# Patient Record
Sex: Male | Born: 1935
Health system: Southern US, Community
[De-identification: ages and names within clinical notes are randomized; demographics above are authoritative.]

## PROBLEM LIST (undated history)

## (undated) DIAGNOSIS — N400 Enlarged prostate without lower urinary tract symptoms: Secondary | ICD-10-CM

## (undated) DIAGNOSIS — R109 Unspecified abdominal pain: Secondary | ICD-10-CM

## (undated) DIAGNOSIS — R6889 Other general symptoms and signs: Secondary | ICD-10-CM

## (undated) DIAGNOSIS — E785 Hyperlipidemia, unspecified: Secondary | ICD-10-CM

## (undated) DIAGNOSIS — I259 Chronic ischemic heart disease, unspecified: Secondary | ICD-10-CM

## (undated) DIAGNOSIS — I2510 Atherosclerotic heart disease of native coronary artery without angina pectoris: Secondary | ICD-10-CM

## (undated) DIAGNOSIS — R310 Gross hematuria: Secondary | ICD-10-CM

## (undated) HISTORY — DX: Atherosclerotic heart disease of native coronary artery without angina pectoris: I25.10

## (undated) HISTORY — DX: Unspecified abdominal pain: R10.9

## (undated) HISTORY — DX: Chronic ischemic heart disease, unspecified: I25.9

## (undated) HISTORY — DX: Gross hematuria: R31.0

## (undated) HISTORY — DX: Other general symptoms and signs: R68.89

## (undated) HISTORY — DX: Benign prostatic hyperplasia without lower urinary tract symptoms: N40.0

## (undated) HISTORY — DX: Hyperlipidemia, unspecified: E78.5

---

## 1986-12-13 HISTORY — PX: HERNIA REPAIR: SHX51

## 1990-12-13 HISTORY — PX: OTHER SURGICAL HISTORY: SHX169

## 1997-12-13 HISTORY — PX: CARDIAC CATHETERIZATION: SHX172

## 1998-09-17 ENCOUNTER — Ambulatory Visit (HOSPITAL_COMMUNITY): Admission: RE | Admit: 1998-09-17 | Discharge: 1998-09-17 | Payer: Self-pay | Admitting: Cardiology

## 2005-09-24 ENCOUNTER — Ambulatory Visit: Payer: Self-pay | Admitting: Internal Medicine

## 2007-05-30 ENCOUNTER — Ambulatory Visit: Payer: Self-pay | Admitting: Internal Medicine

## 2007-05-30 LAB — CONVERTED CEMR LAB
ALT: 42 units/L — ABNORMAL HIGH (ref 0–40)
HDL: 47.4 mg/dL (ref >39.0)
LDL Cholesterol: 63 mg/dL (ref 0–99)
PSA: 0.85 ng/mL (ref 0.10–4.00)
VLDL: 16 mg/dL (ref 0–40)

## 2008-01-05 ENCOUNTER — Ambulatory Visit: Payer: Self-pay | Admitting: Internal Medicine

## 2008-01-05 DIAGNOSIS — R109 Unspecified abdominal pain: Secondary | ICD-10-CM

## 2008-01-05 HISTORY — DX: Unspecified abdominal pain: R10.9

## 2008-01-07 DIAGNOSIS — E785 Hyperlipidemia, unspecified: Secondary | ICD-10-CM

## 2008-01-07 DIAGNOSIS — I2510 Atherosclerotic heart disease of native coronary artery without angina pectoris: Secondary | ICD-10-CM

## 2008-01-07 DIAGNOSIS — N400 Enlarged prostate without lower urinary tract symptoms: Secondary | ICD-10-CM

## 2008-01-07 HISTORY — DX: Atherosclerotic heart disease of native coronary artery without angina pectoris: I25.10

## 2008-01-07 HISTORY — DX: Benign prostatic hyperplasia without lower urinary tract symptoms: N40.0

## 2008-01-07 HISTORY — DX: Hyperlipidemia, unspecified: E78.5

## 2008-01-08 ENCOUNTER — Telehealth: Payer: Self-pay | Admitting: Internal Medicine

## 2008-01-09 ENCOUNTER — Ambulatory Visit: Payer: Self-pay | Admitting: Internal Medicine

## 2008-01-11 ENCOUNTER — Encounter: Payer: Self-pay | Admitting: Internal Medicine

## 2008-05-23 ENCOUNTER — Ambulatory Visit: Payer: Self-pay | Admitting: Internal Medicine

## 2008-05-23 ENCOUNTER — Telehealth: Payer: Self-pay | Admitting: Internal Medicine

## 2008-05-23 DIAGNOSIS — R310 Gross hematuria: Secondary | ICD-10-CM

## 2008-05-23 HISTORY — DX: Gross hematuria: R31.0

## 2008-05-23 LAB — CONVERTED CEMR LAB
Bacteria, UA: NEGATIVE
Leukocytes, UA: NEGATIVE
pH: 5 (ref 5.0–8.0)

## 2008-06-06 ENCOUNTER — Ambulatory Visit: Payer: Self-pay | Admitting: Internal Medicine

## 2008-07-06 IMAGING — CR DG LUMBAR SPINE COMPLETE 4+V
5 series · 5 of 5 positions shown · non-contrast
Comparison: none

CLINICAL DATA: 77-year-old with left-sided low back pain.  No known injury.
LUMBAR SPINE - 4 VIEW:

[view not recorded (1 of 5)]
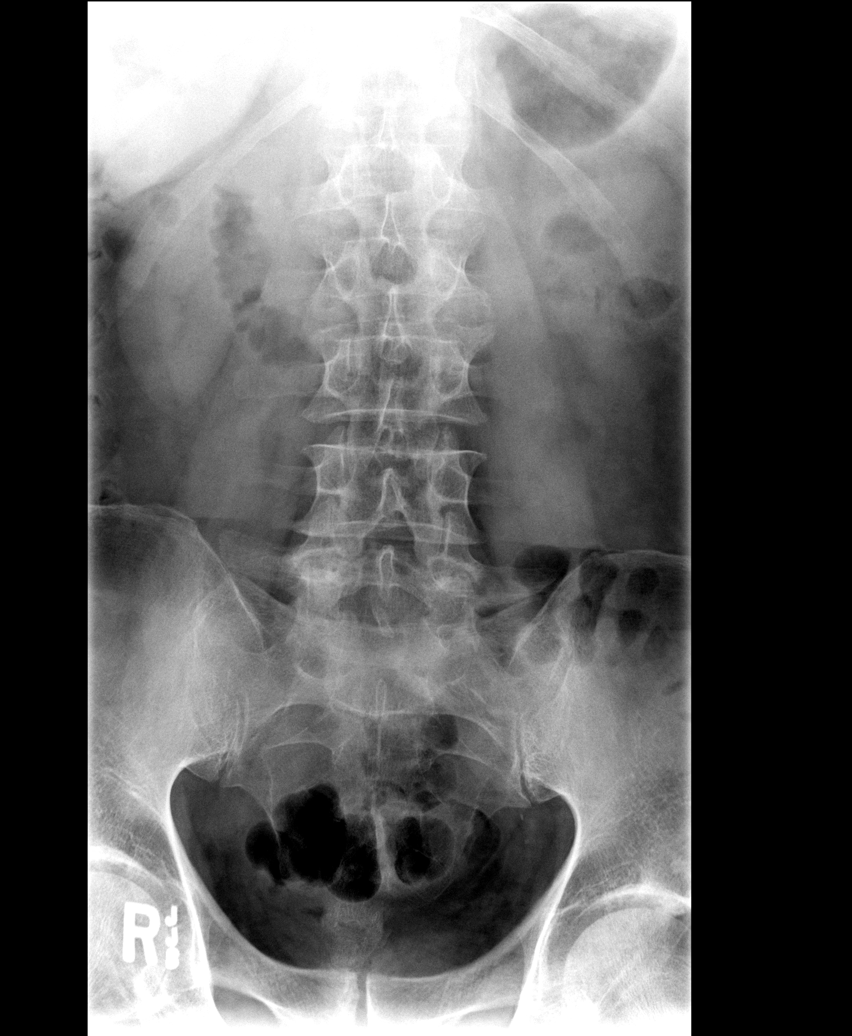

[view not recorded (2 of 5)]
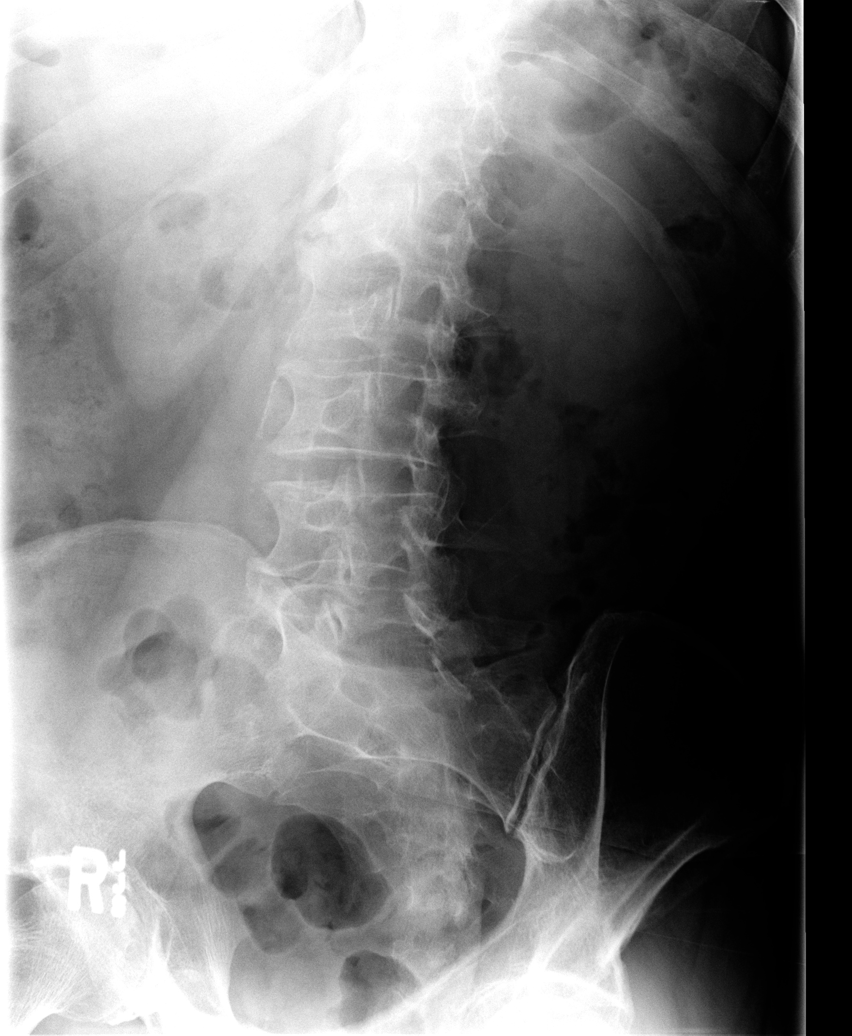

[view not recorded (3 of 5)]
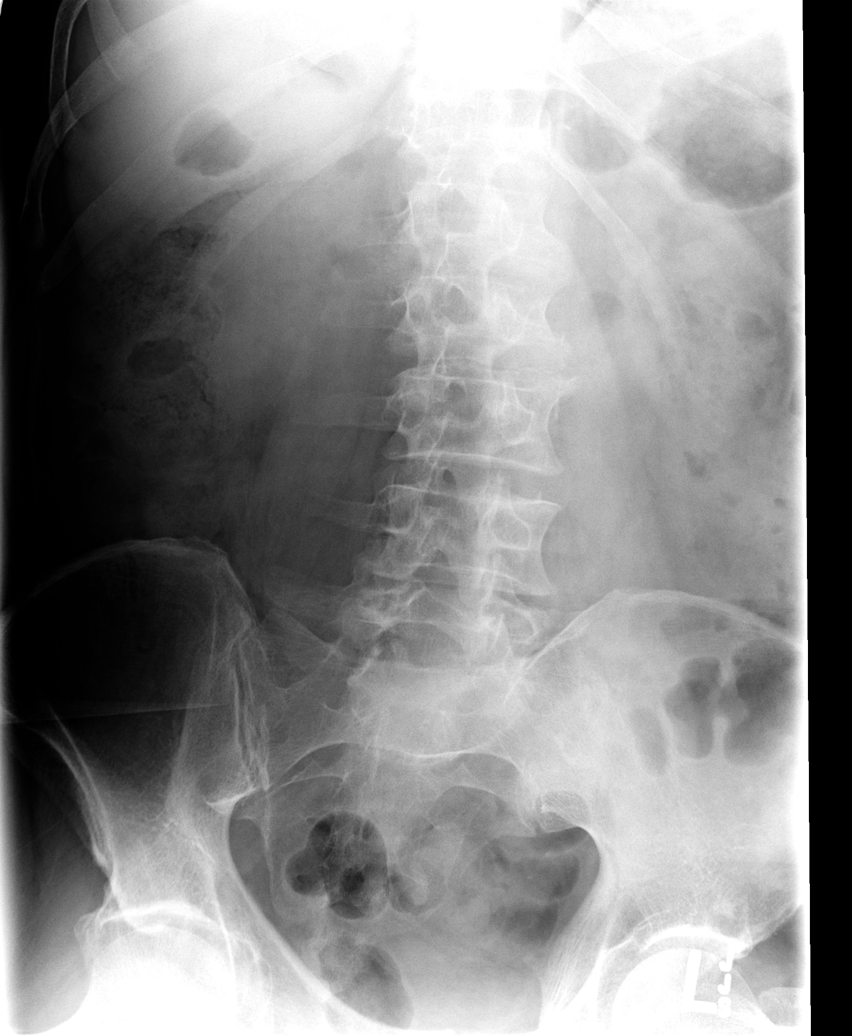

[view not recorded (4 of 5)]
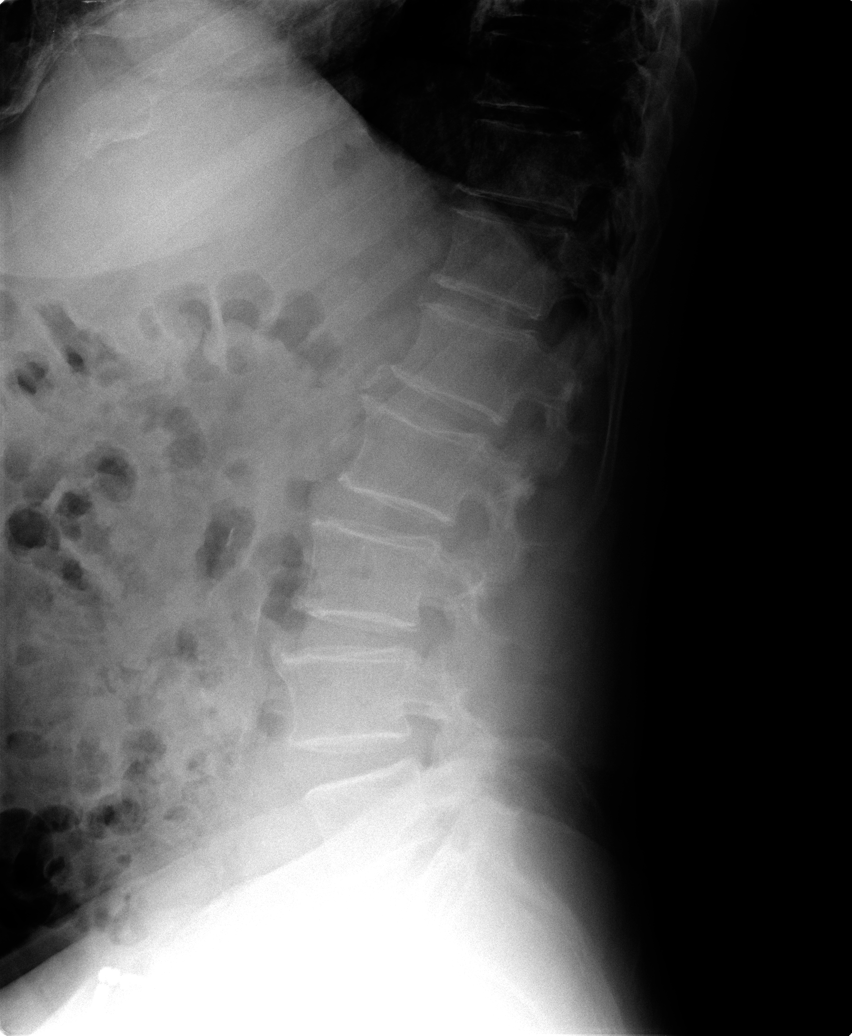

[view not recorded (5 of 5)]
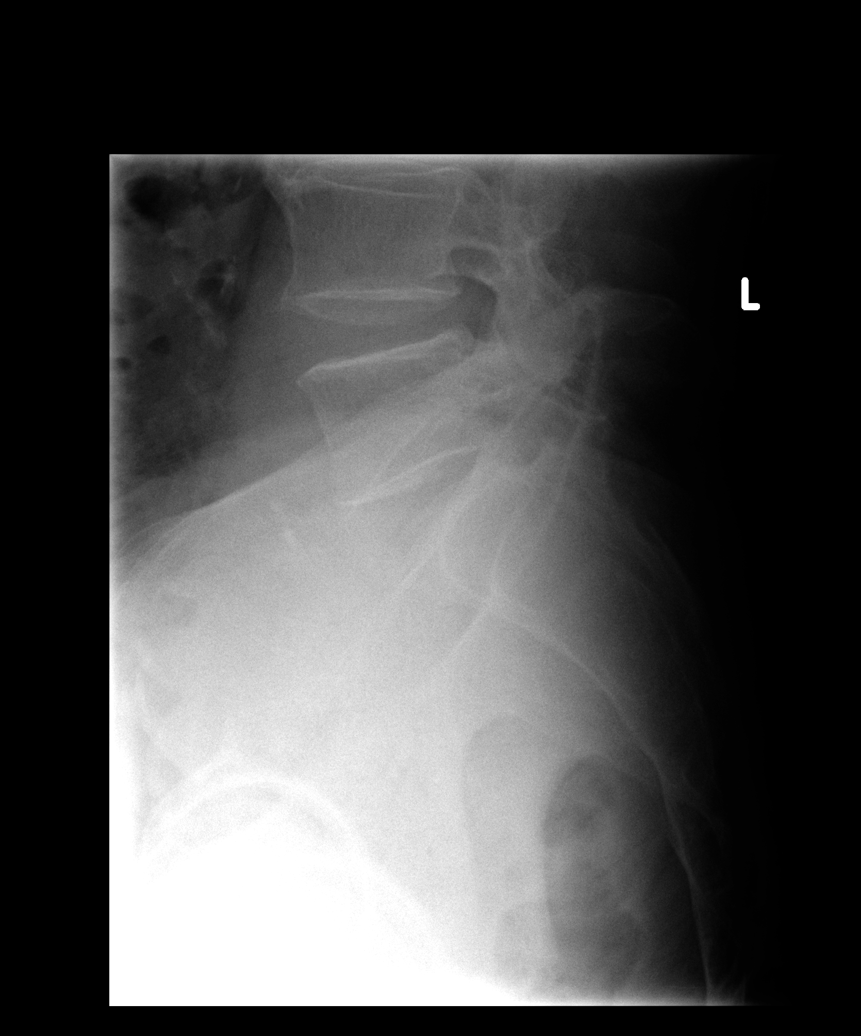

[5 of 5 positions shown; findings below may reference images not displayed]

FINDINGS: AP lateral and oblique images of the lumbar spine were obtained.  Mild degenerative endplate changes throughout the lumbar spine without significant disc space loss.  Normal alignment of the lumbar spine.  No definite pars defect.
IMPRESSION: Mild degenerative changes without acute findings.

## 2009-06-02 ENCOUNTER — Ambulatory Visit: Payer: Self-pay | Admitting: Internal Medicine

## 2010-10-19 ENCOUNTER — Ambulatory Visit: Payer: Self-pay | Admitting: Cardiology

## 2010-10-28 ENCOUNTER — Telehealth (INDEPENDENT_AMBULATORY_CARE_PROVIDER_SITE_OTHER): Payer: Self-pay

## 2010-10-29 ENCOUNTER — Encounter: Payer: Self-pay | Admitting: Cardiovascular Disease

## 2010-10-29 ENCOUNTER — Ambulatory Visit: Payer: Self-pay | Admitting: Cardiology

## 2010-10-29 ENCOUNTER — Encounter: Payer: Self-pay | Admitting: Cardiology

## 2010-10-29 ENCOUNTER — Ambulatory Visit: Payer: Self-pay

## 2010-10-29 ENCOUNTER — Encounter (HOSPITAL_COMMUNITY)
Admission: RE | Admit: 2010-10-29 | Discharge: 2010-12-12 | Payer: Self-pay | Source: Home / Self Care | Attending: Cardiology | Admitting: Cardiology

## 2011-01-12 NOTE — Progress Notes (Signed)
Summary: Nuc. Pre-Procedure  Phone Note Outgoing Call Call back at [facility] Phone [phone]   Call placed by: Irean Hong, RN,  October 28, 2010 1:46 PM Summary of Call: Left message with information on Myoview Information Sheet (see scanned document for details).      Nuclear Med Background Indications for Stress Test: Evaluation for Ischemia   History: Angioplasty, Heart Catheterization, Myocardial Infarction, Myocardial Perfusion Study  History Comments: '90 MI> PTCA LCFX. '95 PTCA RCA. '99 Cath: 60% DX. '09 MPS: (-) ischemia, fixed lateral defect, EF=NL.     Nuclear Pre-Procedure Cardiac Risk Factors: History of Smoking, Lipids Height (in): 70

## 2011-01-12 NOTE — Assessment & Plan Note (Signed)
Summary: Cardiology Nuclear Testing  Nuclear Med Background Indications for Stress Test: Evaluation for Ischemia, PTCA Patency   History: Angioplasty, Heart Catheterization, Myocardial Infarction, Myocardial Perfusion Study  History Comments: '09 MPS:no sig. ischemia, fixed lateral defect   Symptoms Comments: No cardiac complaints   Nuclear Pre-Procedure Cardiac Risk Factors: History of Smoking, Lipids Caffeine/Decaff Intake: None NPO After: 5:00 PM Lungs: Clear IV 0.9% NS with Angio Cath: 20g     IV Site: R Antecubital IV Started by: Irean Hong, RN Chest Size (in) 40     Height (in): 70 Weight (lb): 172 BMI: 24.77 Tech Comments: No meds. today  Nuclear Med Study 1 or 2 day study:  1 day     Stress Test Type:  Stress Reading MD:  Olga Millers, MD     Referring MD:  Roger Shelter, MD Resting Radionuclide:  Technetium 47m Tetrofosmin     Resting Radionuclide Dose:  11 mCi  Stress Radionuclide:  Technetium 69m Tetrofosmin     Stress Radionuclide Dose:  33 mCi   Stress Protocol Exercise Time (min):  11:31 min     Max HR:  139 bpm     Predicted Max HR:  142 bpm  Max Systolic BP: 162 mm Hg     Percent Max HR:  97.89 %     METS: 13.4 Rate Pressure Product:  84132    Stress Test Technologist:  [employer]a [employer], CMA-N     Nuclear Technologist:  Doyne Keel, CNMT  Rest Procedure  Myocardial perfusion imaging was performed at rest 45 minutes following the intravenous administration of Technetium 43m Tetrofosmin.  Stress Procedure  The patient exercised for11:31.  The patient stopped due to fatigue and denied any chest pain.  There were no significant ST-T wave changes, only occasional PVC's and PAC's.  Technetium 57m Tetrofosmin was injected at peak exercise and myocardial perfusion imaging was performed after a brief delay.  QPS Raw Data Images:  There is no interference from other nuclear activity. Stress Images:  There is decreased uptake in the inferolateral wall Rest  Images:  There is decreased uptake in the inferolateral wall; slightly less prominent compared to the stress images. Subtraction (SDS):  These findings are consistent with prior inferolateral infarct and very mild peri-infarct ischemia. Transient Ischemic Dilatation:  .93  (Normal <1.22)  Lung/Heart Ratio:  .33  (Normal <0.45)  Quantitative Gated Spect Images QGS EDV:  135 ml QGS ESV:  62 ml QGS EF:  54 % QGS cine images:  Akinesis of the inferolateral wall; evidence of LVE.   Overall Impression  Exercise Capacity: Good exercise capacity. BP Response: Normal blood pressure response. Clinical Symptoms: No chest pain ECG Impression: No significant ST segment change suggestive of ischemia. Overall Impression: Abnormal stress nuclear study with prior inferolateral infarct and very mild peri-infarct ischemia.  Appended Document: Cardiology Nuclear Testing copy sent to Dr. Deborah Chalk

## 2011-04-15 ENCOUNTER — Encounter: Payer: Self-pay | Admitting: Internal Medicine

## 2011-04-16 ENCOUNTER — Other Ambulatory Visit (INDEPENDENT_AMBULATORY_CARE_PROVIDER_SITE_OTHER): Payer: Medicare Other

## 2011-04-16 ENCOUNTER — Encounter: Payer: Self-pay | Admitting: Internal Medicine

## 2011-04-16 ENCOUNTER — Ambulatory Visit (INDEPENDENT_AMBULATORY_CARE_PROVIDER_SITE_OTHER): Payer: Medicare Other | Admitting: Internal Medicine

## 2011-04-16 DIAGNOSIS — R310 Gross hematuria: Secondary | ICD-10-CM

## 2011-04-16 DIAGNOSIS — E785 Hyperlipidemia, unspecified: Secondary | ICD-10-CM

## 2011-04-16 DIAGNOSIS — Z0000 Encounter for general adult medical examination without abnormal findings: Secondary | ICD-10-CM

## 2011-04-16 DIAGNOSIS — T887XXA Unspecified adverse effect of drug or medicament, initial encounter: Secondary | ICD-10-CM

## 2011-04-16 DIAGNOSIS — N400 Enlarged prostate without lower urinary tract symptoms: Secondary | ICD-10-CM

## 2011-04-16 LAB — HEPATIC FUNCTION PANEL
ALT: 32 U/L (ref 0–53)
AST: 27 U/L (ref 0–37)
Alkaline Phosphatase: 66 U/L (ref 39–117)
Bilirubin, Direct: 0.2 mg/dL (ref 0.0–0.3)
Total Bilirubin: 0.7 mg/dL (ref 0.3–1.2)

## 2011-04-16 LAB — CBC WITH DIFFERENTIAL/PLATELET
Eosinophils Relative: 2.2 % (ref 0.0–5.0)
HCT: 41.3 % (ref 39.0–52.0)
Hemoglobin: 14 g/dL (ref 13.0–17.0)
Lymphs Abs: 2.3 10*3/uL (ref 0.7–4.0)
Monocytes Relative: 9.1 % (ref 3.0–12.0)
Neutro Abs: 4.3 10*3/uL (ref 1.4–7.7)
WBC: 7.5 10*3/uL (ref 4.5–10.5)

## 2011-04-16 LAB — COMPREHENSIVE METABOLIC PANEL
CO2: 26 mEq/L (ref 19–32)
Creatinine, Ser: 1.1 mg/dL (ref 0.4–1.5)
GFR: 70.1 mL/min (ref >60.00)
Glucose, Bld: 90 mg/dL (ref 70–99)
Total Bilirubin: 0.7 mg/dL (ref 0.3–1.2)

## 2011-04-16 NOTE — Progress Notes (Signed)
Subjective:    Patient ID: Wesley [name], male    DOB: 08/19/1930, 77 y.o.   MRN: [id number]  HPI  The patient is here for annual Medicare wellness examination and management of other chronic and acute problems.   The risk factors are reflected in the social history.  The roster of all physicians providing medical care to patient - is listed in the Snapshot section of the chart.  Activities of daily living:  The patient is 100% inedpendent in all ADLs: dressing, toileting, feeding as well as independent mobility  Home safety : The patient has smoke detectors in the home. They wear seatbelts. firearms are present in the home, kept in a safe fashion. There is no violence in the home.   There is no risks for hepatitis, STDs or HIV. There is no   history of blood transfusion. They have no travel history to infectious disease endemic areas of the world.  The patient has has not seen their dentist in the last six month, although he does have appointment set up in May. They have seen their eye doctor in the last year. They admit to  hearing difficulty, wearing hearing aids and have not had audiologic testing in the last year.  They do not  have excessive sun exposure. Discussed the need for sun protection: hats, long sleeves and use of sunscreen if there is significant sun exposure.   Diet: the importance of a healthy diet is discussed. They do have a healthy (unhealthy-high fat/fast food) diet.  Exercise- works out 2 hrs a day 3-4 days a week.  Past Medical History  Diagnosis Date  . HYPERLIPIDEMIA 01/07/2008  . CORONARY ARTERY DISEASE 01/07/2008  . Gross hematuria 05/23/2008  . BENIGN PROSTATIC HYPERTROPHY 01/07/2008  . FLANK PAIN, LEFT 01/05/2008  . Cataract    Past Surgical History  Procedure Date  . Hernia repair 1988    Right  . Nephrolithiasis 1992    w/treatment   Family History  Problem Relation Age of Onset  . Heart disease Brother   . Hyperlipidemia Brother   . Diabetes Neg  Hx   . Colon cancer Neg Hx   . Lung cancer Other     1 sibling deceased   History   Social History  . Marital Status: Widowed    Spouse Name: N/A    Number of Children: N/A  . Years of Education: N/A   Occupational History  . Not on file.   Social History Main Topics  . Smoking status: Former Smoker    Types: Cigarettes    Quit date: 12/13/1968  . Smokeless tobacco: Not on file  . Alcohol Use: No  . Drug Use: No  . Sexually Active: Not on file   Other Topics Concern  . Not on file   Social History Narrative  . No narrative on file        Review of Systems Review of Systems  Constitutional:  Negative for fever, chills, activity change and unexpected weight change.  HENT:  Positive for hearing loss and tinnitis; Negative for  ear pain, congestion, neck stiffness and postnasal drip.   Eyes: Negative for pain, discharge and visual disturbance.  Respiratory: Negative for chest tightness and wheezing.   Cardiovascular: Negative for chest pain and palpitations.       [No decreased exercise tolerance Gastrointestinal: [No change in bowel habit. No bloating or gas. No reflux or indigestion Genitourinary: Positive for urgency, frequency but better on Gelnique and herbal product;  negative for  flank pain and difficulty urinating.  Musculoskeletal: Negative for myalgias, back pain, arthralgias and gait problem.  Neurological: Negative for dizziness, tremors, weakness and headaches.  Hematological: Negative for adenopathy.  Psychiatric/Behavioral: Negative for behavioral problems and dysphoric mood.       Objective:   Physical Exam WNWD white male in no distress HEENT- hearing aids bilaterally, C&S clear, Lens clear, Fundi-normal, oropharynx without lesions. Neck - supple, no thyromegaly,  Nodes- negative neck, supraclavicular, inquinal Chest - clear to A&P Cor - RRR, no murmur, rubs, gallop; no JVD; no carotid bruits Abd - soft, BS+ , no G/R GU- deferred to Dr.  Vernie Ammons MSK - full ROM, no deformity, Neuro- Awake, conversant, good remote memory, 1/3 word recall at 5 minutes, content was minimal, clock face - unusual drawing a 25hr clock face. Derm - clear        Assessment & Plan:  1. CAD - stable with no c/o chest pain, decreased exercise tolerance of other problems  Plan - continue present regimen           Follow up with Dr. Deborah Chalk as instructed  2. Hyperlipidemia  Plan - lab today with recommendations to follow.   3. GU- patient with BPH. Had a history of hematuria which has cleared. He has Irritible bladder improved with  gelnique  Topical.  Plan - follow-up with Dr. Vernie Ammons as instructed  4. Health Maintenance - interval history is benign. PHysical exam is unremarkable. Labs are pending. He does have mild memory impairment but remains highly functional. He is not current with colorectal cancer screening with last study documented at '83. He will be advised to schedule study. Immunizations - current with tetnus and he reports that he has had pneumonia vaccine. Shingles vaccine not documented.  In summary- a nice man who is medically stable but demonstrates mild memory impairment.  He will return as needed or in 1 year.

## 2011-04-19 ENCOUNTER — Encounter: Payer: Self-pay | Admitting: Internal Medicine

## 2011-04-27 NOTE — Assessment & Plan Note (Signed)
[facility]                           PRIMARY CARE OFFICE NOTE   QUIN, MATHENIA                          MRN:          [id number]  DATE:05/30/2007                            DOB:          January 12, 1930    Wesley [name] is a 77 year old Caucasian gentleman followed  for coronary  artery disease, hyperlipidemia, who presents for followup evaluation and  exam.  The patient was last seen in the office September 24, 2005 for a  pulled muscle in his back.  He reports in the interval he has been  feeling well and doing well.  He has seen Dr. Delfin Edis, his  cardiologist, in followup, 2007.   The patient reports he is in need of a tetanus shot.   The patient reports he does have a problem with snoring and lies on his  side which causes him to have sweating on 1 side.   PAST MEDICAL HISTORY:   SURGICAL:  1. Right herniorrhaphy in 1988.  2. Kidney stones, treatment in 1992.   MEDICAL:  1. Usual childhood disease.  2. MI, 1990, with cardiac cath and angioplasty.  3. Hyperlipidemia.  4. BPH.  5. Cataracts.   CURRENT MEDICATIONS:  1. Lipitor 10 mg daily.  2. Aspirin 325 mg daily.  3. Doxazosin 2 mg nightly.   HABITS:  The patient had a 35-pack-year smoking history, but quit at age  57.  He is an occasional drinker of wine.   FAMILY HISTORY:  Father died of pancreatic cancer.  Mother died of old  age.  The patient had 1 sibling die of lung cancer.  One brother had  heart trouble and high cholesterol.   SOCIAL HISTORY:  The patient has been married 44 years.  He has a grown  son.  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   CHART REVIEW:  Last colonoscopy, May 18, 2004, with a normal study.  Cath was in 1995.  One-day nuclear Cardiolite study performed by Dr.  Deborah Chalk, October of 2001, which was unremarkable.  The patient had  hearing testing March 31, 2007 with results having problem with word  recognition, 68% on the right, 80% on the  left.  He was advised to use  hearing protection.  He does not need hearing amplification at this  time.  The patient had a CT scan of the abdomen and pelvis, November  2005, normal abdomen, normal pelvis.   REVIEW OF SYSTEMS:  The patient has had no constitutional problems.  He  has had an eye exam recently which revealed bilateral cataracts.  No  ears, nose, and throat, cardiovascular, respiratory problems.  No GI  changes.  He has nocturia x3 which is long standing.  The patient does  have some soreness in his left shoulder.  No dermatologic or neurologic  problems.   PHYSICAL EXAMINATION:  Temperature was 97.4, blood pressure 111/67,  pulse was 57, weight 186.  Height is 5 feet 11 inches.  GENERAL APPEARANCE:  A well-nourished, well-developed Caucasian  gentleman, looks younger than his stated chronologic age, in  no acute  distress.  HEENT EXAM:  Normocephalic, atraumatic.  EACs and TMs were unremarkable.  Oropharynx without lesions.  Buccal membranes were clear.  Posterior  pharynx was clear.  Conjunctivae and sclerae were clear.  Pupils are  equal, round, and reactive to light and accommodation.  NECK:  Supple without thyromegaly.  NODES:  No lymphadenopathy was noted in the cervical or supraclavicular  regions.  CHEST:  No CVA tenderness.  He has mild gynecomastia.  LUNGS:  Clear with no rales, wheezes, or rhonchi.  CARDIOVASCULAR:  2+ radial pulses, no JVD or carotid bruits.  He had a  quiet precordium with a regular rate and rhythm without murmurs, rubs,  or gallops.  ABDOMEN:  Soft, no guarding, no rebound.  No organosplenomegaly was  appreciated.  The patient does have a ventral hernia approximately 3 cm  in diameter, approximately 4 cm above the umbilicus.  It is nontender,  soft, easily reducible.  RECTAL EXAM:  Normal sphincter tone was noted.  Prostate is smooth,  round and normal in size and contour without abnormalities.  EXTREMITIES:  Without cyanosis, clubbing,  edema, no deformities are  noted.   LABORATORY DATA:  Reveals a PSA of 0.85, liver functions were normal,  cholesterol was 126, triglycerides 78, HDL 47.4, LDL was 63.   ASSESSMENT AND PLAN:  1. Hyperlipidemia:  The patient is very well controlled on low-dose      Lipitor, he will continue the same.  2. Benign prostatic hypertrophy:  The patient gets good relief with      doxazosin with minimal nocturia and will continue the same.  3. Coronary artery disease:  The patient is up to date with Dr. Delfin Edis.  Certainly his risk factors are well controlled with low      blood pressure and low cholesterol.  4. Health maintenance:  The patient is clear with colorectal cancer      screening.  He was given a Tdap immunization at today's visit.  The      patient did have his Zostavax at an outside facility, December 27, 2006.   In summary, this is a very pleasant gentleman who seems to be medically  stable at this time.  I have asked him to return to see me in 1 year.     Wesley Gess Norins, MD  Electronically Signed    MEN/MedQ  DD: 05/31/2007  DT: 05/31/2007  Job #: [id number]   cc:   Mr. Leonides Sake. Deborah Chalk, M.D.

## 2011-08-20 ENCOUNTER — Other Ambulatory Visit: Payer: Self-pay | Admitting: Cardiology

## 2011-08-20 NOTE — Telephone Encounter (Signed)
[facility] [address] in [place]/  Lipitor Generic

## 2011-10-26 ENCOUNTER — Encounter: Payer: Self-pay | Admitting: Cardiovascular Disease

## 2011-10-26 ENCOUNTER — Ambulatory Visit (INDEPENDENT_AMBULATORY_CARE_PROVIDER_SITE_OTHER): Payer: Medicare Other | Admitting: Cardiovascular Disease

## 2011-10-26 VITALS — BP 124/64 | HR 50 | Ht 71.0 in | Wt 168.0 lb

## 2011-10-26 DIAGNOSIS — I2510 Atherosclerotic heart disease of native coronary artery without angina pectoris: Secondary | ICD-10-CM

## 2011-10-26 NOTE — Patient Instructions (Signed)
Your physician wants you to follow-up in: 12 months.  You will receive a reminder letter in the mail two months in advance. If you don't receive a letter, please call our office to schedule the follow-up appointment.  Your physician recommends that you continue on your current medications as directed. Please refer to the Current Medication list given to you today.   

## 2011-10-26 NOTE — Assessment & Plan Note (Signed)
Stable. Continue current therapy. His lipids and BP are well controlled. He is exercising every day.

## 2011-10-26 NOTE — Progress Notes (Signed)
History of Present Illness:77 yo WM with history of HLD, CAD, BPH here today for cardiac follow up. He has been followed in the past by Dr. Deborah Chalk.  His cardiac history of CAD with first angioplasty in 1990 in the circumflex artery, angioplasty RCA in 1995. His last cardiac catheterization was in 1999 with moderate disease in the diagonal vessel but minimal disease otherwise. Stress test  In 2009 showed a fixed lateral defect with low normal EF and no evidence of ischemia.   He tells me that he has been feeling well. No chest pain or SOB. No dizziness, palpitations, near syncope or syncope.   His primary care is Dr. Debby Bud. Lipids are followed in primary care.   Past Medical History  Diagnosis Date  . HYPERLIPIDEMIA 01/07/2008  . CORONARY ARTERY DISEASE 01/07/2008  . Gross hematuria 05/23/2008  . BENIGN PROSTATIC HYPERTROPHY 01/07/2008  . FLANK PAIN, LEFT 01/05/2008  . Cataract     Past Surgical History  Procedure Date  . Hernia repair 1988    Right  . Nephrolithiasis 1992    w/treatment    Current Outpatient Prescriptions  Medication Sig Dispense Refill  . Alfalfa 650 MG TABS Take by mouth 1 day or 1 dose.       Marland Kitchen aspirin 325 MG tablet Take 325 mg by mouth daily.        Marland Kitchen atorvastatin (LIPITOR) 10 MG tablet        . calcium carbonate (OS-CAL) 600 MG TABS Take 600 mg by mouth daily.        . cholecalciferol (VITAMIN D) 400 UNITS TABS Take by mouth. VITAMIN D3 1 tab twice a day       . doxazosin (CARDURA) 2 MG tablet Take 2 mg by mouth at bedtime.        . magnesium 30 MG tablet Take 30 mg by mouth daily.        . Multiple Vitamin (MULTIVITAMIN) tablet Take 1 tablet by mouth daily.        . Omega-3 Fatty Acids (FISH OIL) 1000 MG CPDR Take by mouth 2 (two) times daily.        . Oxybutynin Chloride (GELNIQUE) 10 % GEL Place onto the skin.        Marland Kitchen psyllium (METAMUCIL SMOOTH TEXTURE) 28 % packet Take 1 packet by mouth 2 (two) times daily.        . vitamin B-12 (CYANOCOBALAMIN) 1000 MCG  tablet Take 1,000 mcg by mouth daily.          No Known Allergies  History   Social History  . Marital Status: Widowed    Spouse Name: N/A    Number of Children: N/A  . Years of Education: N/A   Occupational History  . Not on file.   Social History Main Topics  . Smoking status: Former Smoker    Types: Cigarettes    Quit date: 12/13/1968  . Smokeless tobacco: Not on file  . Alcohol Use: 3.5 oz/week    7 drink(s) per week  . Drug Use: No  . Sexually Active: No   Other Topics Concern  . Not on file   Social History Narrative   [employer].  [employer] 4 years. Married '63 - widowed '10. 1 son - '67. 3 grandchildren.  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 End of Life Care- patient has not considered this: he does indicate he is not sure abour resuscitation, he does not want to be maintained in a less functional state or  want heroic measures.(May '12)    Family History  Problem Relation Age of Onset  . Heart disease Brother   . Hyperlipidemia Brother   . Diabetes Neg Hx   . Colon cancer Neg Hx   . Lung cancer Other     1 sibling deceased    Review of Systems:  As stated in the HPI and otherwise negative.   BP 124/64  Pulse 50  Ht 5\' 11"  (1.803 m)  Wt 168 lb (76.204 kg)  BMI 23.43 kg/m2  Physical Examination: General: Well developed, well nourished, NAD HEENT: OP clear, mucus membranes moist SKIN: warm, dry. No rashes. Neuro: No focal deficits Musculoskeletal: Muscle strength 5/5 all ext Psychiatric: Mood and affect normal Neck: No JVD, no carotid bruits, no thyromegaly, no lymphadenopathy. Lungs:Clear bilaterally, no wheezes, rhonci, crackles Cardiovascular: Regular rate and rhythm. No murmurs, gallops or rubs. Abdomen:Soft. Bowel sounds present. Non-tender.  Extremities: No lower extremity edema. Pulses are 2 + in the bilateral DP/PT.  EKG: Sinus bradycardia, rate 50 bpm. Non-specific T wave flattening.

## 2012-02-03 ENCOUNTER — Other Ambulatory Visit: Payer: Self-pay | Admitting: Cardiothoracic Surgery

## 2012-02-03 ENCOUNTER — Other Ambulatory Visit: Payer: Self-pay | Admitting: Cardiovascular Disease

## 2012-02-04 ENCOUNTER — Other Ambulatory Visit: Payer: Self-pay

## 2012-02-04 MED ORDER — ATORVASTATIN CALCIUM 10 MG PO TABS: 10.0000 mg | ORAL_TABLET | Freq: Every day | ORAL | Status: DC

## 2012-05-17 ENCOUNTER — Encounter: Payer: Self-pay | Admitting: Cardiology

## 2012-06-26 ENCOUNTER — Encounter: Payer: Self-pay | Admitting: Internal Medicine

## 2012-06-26 ENCOUNTER — Other Ambulatory Visit (INDEPENDENT_AMBULATORY_CARE_PROVIDER_SITE_OTHER): Payer: Medicare Other

## 2012-06-26 ENCOUNTER — Ambulatory Visit (INDEPENDENT_AMBULATORY_CARE_PROVIDER_SITE_OTHER): Payer: Medicare Other | Admitting: Internal Medicine

## 2012-06-26 VITALS — BP 106/78 | HR 60 | Temp 97.3°F | Resp 16 | Wt 174.0 lb

## 2012-06-26 DIAGNOSIS — I2510 Atherosclerotic heart disease of native coronary artery without angina pectoris: Secondary | ICD-10-CM

## 2012-06-26 DIAGNOSIS — E785 Hyperlipidemia, unspecified: Secondary | ICD-10-CM

## 2012-06-26 DIAGNOSIS — Z0000 Encounter for general adult medical examination without abnormal findings: Secondary | ICD-10-CM

## 2012-06-26 DIAGNOSIS — N400 Enlarged prostate without lower urinary tract symptoms: Secondary | ICD-10-CM

## 2012-06-26 LAB — HEPATIC FUNCTION PANEL
Albumin: 4.2 g/dL (ref 3.5–5.2)
Total Protein: 7 g/dL (ref 6.0–8.3)

## 2012-06-26 LAB — COMPREHENSIVE METABOLIC PANEL
ALT: 32 U/L (ref 0–53)
AST: 34 U/L (ref 0–37)
Albumin: 4.2 g/dL (ref 3.5–5.2)
CO2: 26 mEq/L (ref 19–32)
Calcium: 9.3 mg/dL (ref 8.4–10.5)
Chloride: 104 mEq/L (ref 96–112)
Potassium: 4.1 mEq/L (ref 3.5–5.1)
Total Protein: 7 g/dL (ref 6.0–8.3)

## 2012-06-26 LAB — LIPID PANEL
LDL Cholesterol: 40 mg/dL (ref 0–99)
Total CHOL/HDL Ratio: 2

## 2012-06-26 NOTE — Patient Instructions (Addendum)
Normal exam today. Full report to follow.

## 2012-06-26 NOTE — Progress Notes (Signed)
Subjective:    Patient ID: Wesley Song., male    DOB: 08/03/1930, 78 y.o.   MRN: [id number]  HPI Wesley [name] is here for annual Medicare wellness examination and management of other chronic and acute problems.   The risk factors are reflected in the social history.  The roster of all physicians providing medical care to patient - is listed in the Snapshot section of the chart.  Activities of daily living:  The patient is 100% inedpendent in all ADLs: dressing, toileting, feeding as well as independent mobility  Home safety : The patient has smoke detectors in the home. They wear seatbelts. firearms are present in the home, kept in a safe fashion. There is no violence in the home.   There is no risks for hepatitis, STDs or HIV. There is no   history of blood transfusion. They have no travel history to infectious disease endemic areas of the world.  The patient has seen their dentist in the last six month. They have seen their eye doctor in the last year. They admit to hearing difficulty and has bilateral hearing aids.  have not had audiologic testing in the last year.  They do not  have excessive sun exposure. Discussed the need for sun protection: hats, long sleeves and use of sunscreen if there is significant sun exposure.   Diet: the importance of a healthy diet is discussed. They do have a healthy diet.  The patient has a regular exercise program: cardio and works out , 120- min duration, 5 per week.  The benefits of regular aerobic exercise were discussed.  Depression screen: there are no signs or vegative symptoms of depression- irritability, change in appetite, anhedonia, sadness/tearfullness.  Cognitive assessment: the patient manages all their financial and personal affairs and is actively engaged.   The following portions of the patient's history were reviewed and updated as appropriate: allergies, current medications, past family history, past medical history,  past surgical  history, past social history  and problem list.  Vision, hearing, body mass index were assessed and reviewed.   During the course of the visit the patient was educated and counseled about appropriate screening and preventive services including : fall prevention , diabetes screening, nutrition counseling, colorectal cancer screening, and recommended immunizations.  Past Medical History  Diagnosis Date  . HYPERLIPIDEMIA 01/07/2008  . CORONARY ARTERY DISEASE 01/07/2008  . Gross hematuria 05/23/2008  . BENIGN PROSTATIC HYPERTROPHY 01/07/2008  . FLANK PAIN, LEFT 01/05/2008  . Cataract   . Ischemic heart disease   . Hypokinesia    Past Surgical History  Procedure Date  . Hernia repair 1988    Right  . Nephrolithiasis 1992    w/treatment  . Cardiac catheterization 1999   Family History  Problem Relation Age of Onset  . Heart disease Brother   . Hyperlipidemia Brother   . Diabetes Neg Hx   . Colon cancer Neg Hx   . Lung cancer Other     1 sibling deceased  . Prostate cancer Father 42  . Polycythemia Mother 74  . Heart attack Brother 58   History   Social History  . Marital Status: Widowed    Spouse Name: N/A    Number of Children: N/A  . Years of Education: N/A   Occupational History  . Not on file.   Social History Main Topics  . Smoking status: Former Smoker    Types: Cigarettes    Quit date: 12/13/1968  . Smokeless tobacco: Not on  file  . Alcohol Use: 3.5 oz/week    7 drink(s) per week  . Drug Use: No  . Sexually Active: No   Other Topics Concern  . Not on file   Social History Narrative   HSG.  [employer] 4 years. Married '63 - widowed '10. 1 son - '67. 3 grandchildren.  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 End of Life Care- patient has not considered this: he does indicate he is not sure abour resuscitation, he does not want to be maintained in a less functional state or want heroic measures.(May '12)    Current Outpatient Prescriptions on File Prior to Visit  Medication  Sig Dispense Refill  . Alfalfa 650 MG TABS Take by mouth 1 day or 1 dose.       Marland Kitchen aspirin 325 MG tablet Take 325 mg by mouth daily.        . calcium carbonate (OS-CAL) 600 MG TABS Take 600 mg by mouth daily.        . cholecalciferol (VITAMIN D) 400 UNITS TABS Take by mouth. VITAMIN D3 1 tab twice a day       . doxazosin (CARDURA) 2 MG tablet Take 2 mg by mouth at bedtime.        . magnesium 30 MG tablet Take 30 mg by mouth daily.        . Multiple Vitamin (MULTIVITAMIN) tablet Take 1 tablet by mouth daily.        . Omega-3 Fatty Acids (FISH OIL) 1000 MG CPDR Take by mouth 2 (two) times daily.        . Oxybutynin Chloride (GELNIQUE) 10 % GEL Place onto the skin.        Marland Kitchen psyllium (METAMUCIL SMOOTH TEXTURE) 28 % packet Take 1 packet by mouth 2 (two) times daily.        . vitamin B-12 (CYANOCOBALAMIN) 1000 MCG tablet Take 1,000 mcg by mouth daily.           Review of Systems Constitutional:  Negative for fever, chills, activity change and unexpected weight change.  HEENT:  Negative for hearing loss, ear pain, congestion, neck stiffness and postnasal drip. Negative for sore throat or swallowing problems. Negative for dental complaints.   Eyes: Negative for vision loss or change in visual acuity.  Respiratory: Negative for chest tightness and wheezing. Negative for DOE.   Cardiovascular: Negative for chest pain or palpitations. No decreased exercise tolerance Gastrointestinal: No change in bowel habit. No bloating or gas. No reflux or indigestion Genitourinary: Negative for urgency, frequency, flank pain and difficulty urinating.  Musculoskeletal: Negative for myalgias, back pain, arthralgias and gait problem.  Neurological: Negative for dizziness, tremors, weakness and headaches.  Hematological: Negative for adenopathy.  Psychiatric/Behavioral: Negative for behavioral problems and dysphoric mood.       Objective:   Physical Exam Filed Vitals:   06/26/12 1452  BP: 106/78  Pulse: 60    Temp: 97.3 F (36.3 C)  Resp: 16   Wt Readings from Last 3 Encounters:  06/26/12 174 lb (78.926 kg)  10/26/11 168 lb (76.204 kg)  04/16/11 168 lb (76.204 kg)    Gen'l: Well nourished well developed, elderly white male in no acute distress  HEENT: Head: Normocephalic and atraumatic. Right Ear: hearing aid in place. Left Ear:  Hearing aid in place. Nose: Nose normal. Mouth/Throat: Oropharynx is clear and moist. Dentition - native, in good repair. No buccal or palatal lesions. Posterior pharynx clear. Eyes: Conjunctivae and sclera clear. EOM intact. Pupils  are equal, round, and reactive to light. Right eye exhibits no discharge. Left eye exhibits no discharge. Lids droop a little but do not obscure vision. Neck: Normal range of motion. Neck supple. No JVD present. No tracheal deviation present. No thyromegaly present.  Cardiovascular: Normal rate, regular rhythm, no gallop, no friction rub, no murmur heard.      Quiet precordium. 2+ radial and DP pulses . No carotid bruits Pulmonary/Chest: Effort normal. No respiratory distress or increased WOB, no wheezes, no rales. No chest wall deformity or CVAT. Abdominal: Soft. Bowel sounds are normal in all quadrants. He exhibits no distension, no tenderness, no rebound or guarding, No heptosplenomegaly  Genitourinary:  deferred Musculoskeletal: Normal range of motion. He exhibits no edema and no tenderness.       Small and large joints without redness, synovial thickening or deformity. Full range of motion preserved about all small, median and large joints.  Lymphadenopathy:    He has no cervical or supraclavicular adenopathy.  Neurological: He is alert and oriented to person, place, and time. CN II-XII intact. DTRs 2+ and symmetrical biceps, radial and patellar tendons. Cerebellar function normal with no tremor, rigidity, normal gait and station.  Skin: Skin is warm and dry. No rash noted. No erythema.  Psychiatric: He has a normal mood and affect. His  behavior is normal. Thought content normal.   Lab Results  Component Value Date   WBC 7.5 04/16/2011   HGB 14.0 04/16/2011   HCT 41.3 04/16/2011   PLT 226.0 04/16/2011   GLUCOSE 100* 06/26/2012   CHOL 111 06/26/2012   TRIG 120.0 06/26/2012   HDL 47.20 06/26/2012   LDLCALC 40 06/26/2012        ALT 32 06/26/2012   AST 34 06/26/2012        NA 140 06/26/2012   K 4.1 06/26/2012   CL 104 06/26/2012   CREATININE 1.2 06/26/2012   BUN 15 06/26/2012   CO2 26 06/26/2012   TSH 2.21 04/16/2011   PSA 0.85 05/30/2007         Assessment & Plan:

## 2012-06-27 DIAGNOSIS — Z0000 Encounter for general adult medical examination without abnormal findings: Secondary | ICD-10-CM | POA: Insufficient documentation

## 2012-06-27 MED ORDER — FINASTERIDE 5 MG PO TABS: 5.0000 mg | ORAL_TABLET | Freq: Every day | ORAL | Status: AC

## 2012-06-27 NOTE — Assessment & Plan Note (Signed)
Failed proscar alone. Incomplete relief with tamsulosin - nocturia, decreased stream.  Paln - continue tamsulosin  Add back proscar 5 mg daily.

## 2012-06-27 NOTE — Assessment & Plan Note (Signed)
Interval history unremarkable except for continue prostatism. Physical exam was normal. He reports previous colonoscopy - not in EMR: will have old chart pulled. Lab results are good. No immunizations on record: he is advised to have shingles vaccine, pneumonia vaccine and tetanus booster.  In summary - a very nice man who appears to be medically stable. He will continue his regular exercise program. He is asked to return in 6 months for routine follow-up.

## 2012-06-27 NOTE — Assessment & Plan Note (Signed)
Very good control with LDL (bad) cholesterol better than goal for any cardiac patient at less than 80. HDL is also OK  Plan Continue low dose lipitor dosing.

## 2012-06-27 NOTE — Assessment & Plan Note (Signed)
No c/o chest pain or decreased exercise tolerance. Last stress test in Nov '12 - negative. Last OV Dr. Sanjuana Kava in Nov '12 - stable.

## 2012-11-03 ENCOUNTER — Encounter: Payer: Self-pay | Admitting: Cardiovascular Disease

## 2012-11-03 ENCOUNTER — Ambulatory Visit (INDEPENDENT_AMBULATORY_CARE_PROVIDER_SITE_OTHER): Payer: Medicare Other | Admitting: Cardiovascular Disease

## 2012-11-03 VITALS — BP 110/66 | HR 47 | Ht 71.0 in | Wt 173.8 lb

## 2012-11-03 DIAGNOSIS — I2510 Atherosclerotic heart disease of native coronary artery without angina pectoris: Secondary | ICD-10-CM

## 2012-11-03 NOTE — Progress Notes (Signed)
History of Present Illness: 78 yo WM with history of HLD, CAD, BPH here today for cardiac follow up. He has been followed in the past by Dr. Deborah Chalk. His cardiac history includes first angioplasty in 1990 in the circumflex artery, angioplasty RCA in 1995. His last cardiac catheterization was in 1999 with moderate disease in the diagonal vessel but minimal disease otherwise. Stress test In 2009 showed a fixed lateral defect with low normal EF and no evidence of ischemia.   He tells me that he has been feeling well. No chest pain or SOB. No dizziness, palpitations, near syncope or syncope. He works at the [employer] on Tuesday and Wednesday and exercise the other days at [employer].   Primary Care Physician: Dr. Debby Bud  Last Lipid Profile:Lipid Panel     Component Value Date/Time   CHOL 111 06/26/2012 1548   TRIG 120.0 06/26/2012 1548   HDL 47.20 06/26/2012 1548   CHOLHDL 2 06/26/2012 1548   VLDL 24.0 06/26/2012 1548   LDLCALC 40 06/26/2012 1548     Past Medical History  Diagnosis Date  . HYPERLIPIDEMIA 01/07/2008  . CORONARY ARTERY DISEASE 01/07/2008  . Gross hematuria 05/23/2008  . BENIGN PROSTATIC HYPERTROPHY 01/07/2008  . FLANK PAIN, LEFT 01/05/2008  . Cataract   . Ischemic heart disease   . Hypokinesia     Past Surgical History  Procedure Date  . Hernia repair 1988    Right  . Nephrolithiasis 1992    w/treatment  . Cardiac catheterization 1999    Current Outpatient Prescriptions  Medication Sig Dispense Refill  . Alfalfa 650 MG TABS Take by mouth 1 day or 1 dose.       Marland Kitchen aspirin 325 MG tablet Take 325 mg by mouth daily.        Marland Kitchen atorvastatin (LIPITOR) 10 MG tablet Take 10 mg by mouth daily.      . calcium carbonate (OS-CAL) 600 MG TABS Take 600 mg by mouth daily.        . cholecalciferol (VITAMIN D) 400 UNITS TABS Take by mouth. VITAMIN D3 1 tab twice a day       . doxazosin (CARDURA) 2 MG tablet Take 2 mg by mouth at bedtime.        . finasteride (PROSCAR) 5 MG tablet  Take 1 tablet (5 mg total) by mouth daily.  30 tablet  11  . magnesium 30 MG tablet Take 30 mg by mouth daily.        . Multiple Vitamin (MULTIVITAMIN) tablet Take 1 tablet by mouth daily.        . Omega-3 Fatty Acids (FISH OIL) 1000 MG CPDR Take by mouth 2 (two) times daily.        . Oxybutynin Chloride (GELNIQUE) 10 % GEL Place onto the skin.        Marland Kitchen psyllium (METAMUCIL SMOOTH TEXTURE) 28 % packet Take 1 packet by mouth 2 (two) times daily.        . vitamin B-12 (CYANOCOBALAMIN) 1000 MCG tablet Take 1,000 mcg by mouth daily.          No Known Allergies  History   Social History  . Marital Status: Widowed    Spouse Name: N/A    Number of Children: N/A  . Years of Education: N/A   Occupational History  . Not on file.   Social History Main Topics  . Smoking status: Former Smoker    Types: Cigarettes    Quit date: 12/13/1968  .  Smokeless tobacco: Not on file  . Alcohol Use: 3.5 oz/week    7 drink(s) per week  . Drug Use: No  . Sexually Active: No   Other Topics Concern  . Not on file   Social History Narrative   HSG.  [employer] 4 years. Married '63 - widowed '10. 1 son - '67. 3 grandchildren.  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 End of Life Care- patient has not considered this: he does indicate he is not sure abour resuscitation, he does not want to be maintained in a less functional state or want heroic measures.(May '12)    Family History  Problem Relation Age of Onset  . Heart disease Brother   . Hyperlipidemia Brother   . Diabetes Neg Hx   . Colon cancer Neg Hx   . Lung cancer Other     1 sibling deceased  . Prostate cancer Father 30  . Polycythemia Mother 71  . Heart attack Brother 50    Review of Systems:  As stated in the HPI and otherwise negative.   BP 110/66  Pulse 47  Ht 5\' 11"  (1.803 m)  Wt 173 lb 12.8 oz (78.835 kg)  BMI 24.24 kg/m2  Physical Examination: General: Well developed, well nourished, NAD HEENT: OP clear, mucus membranes moist SKIN: warm,  dry. No rashes. Neuro: No focal deficits Musculoskeletal: Muscle strength 5/5 all ext Psychiatric: Mood and affect normal Neck: No JVD, no carotid bruits, no thyromegaly, no lymphadenopathy. Lungs:Clear bilaterally, no wheezes, rhonci, crackles Cardiovascular: Huston Foley, Regular rhythm. No murmurs, gallops or rubs. Abdomen:Soft. Bowel sounds present. Non-tender.  Extremities: No lower extremity edema. Pulses are 2 + in the bilateral DP/PT.  EKG: Sinus brady, rate 47 bpm.   Assessment and Plan:   1. CORONARY ARTERY DISEASE: Stable. Continue current therapy. His lipids and BP are well controlled. He is exercising every day.

## 2012-11-03 NOTE — Patient Instructions (Addendum)
Your physician wants you to follow-up in:  12 months.  You will receive a reminder letter in the mail two months in advance. If you don't receive a letter, please call our office to schedule the follow-up appointment.   

## 2013-02-15 ENCOUNTER — Encounter: Payer: Self-pay | Admitting: Cardiology

## 2013-03-20 ENCOUNTER — Encounter: Payer: Self-pay | Admitting: Cardiology

## 2013-06-25 ENCOUNTER — Other Ambulatory Visit: Payer: Self-pay | Admitting: Cardiovascular Disease

## 2013-08-27 ENCOUNTER — Ambulatory Visit (INDEPENDENT_AMBULATORY_CARE_PROVIDER_SITE_OTHER): Payer: Medicare Other | Admitting: Internal Medicine

## 2013-08-27 ENCOUNTER — Encounter: Payer: Self-pay | Admitting: Internal Medicine

## 2013-08-27 ENCOUNTER — Other Ambulatory Visit (INDEPENDENT_AMBULATORY_CARE_PROVIDER_SITE_OTHER): Payer: Medicare Other

## 2013-08-27 VITALS — BP 120/54 | HR 65 | Temp 96.9°F | Ht 72.0 in | Wt 177.4 lb

## 2013-08-27 DIAGNOSIS — E785 Hyperlipidemia, unspecified: Secondary | ICD-10-CM

## 2013-08-27 DIAGNOSIS — Z23 Encounter for immunization: Secondary | ICD-10-CM

## 2013-08-27 DIAGNOSIS — N400 Enlarged prostate without lower urinary tract symptoms: Secondary | ICD-10-CM

## 2013-08-27 DIAGNOSIS — I2510 Atherosclerotic heart disease of native coronary artery without angina pectoris: Secondary | ICD-10-CM

## 2013-08-27 DIAGNOSIS — Z0000 Encounter for general adult medical examination without abnormal findings: Secondary | ICD-10-CM

## 2013-08-27 LAB — HEPATIC FUNCTION PANEL
ALT: 26 U/L (ref 0–53)
AST: 22 U/L (ref 0–37)
Bilirubin, Direct: 0.2 mg/dL (ref 0.0–0.3)
Total Bilirubin: 0.7 mg/dL (ref 0.3–1.2)
Total Protein: 7 g/dL (ref 6.0–8.3)

## 2013-08-27 LAB — COMPREHENSIVE METABOLIC PANEL
Alkaline Phosphatase: 59 U/L (ref 39–117)
BUN: 16 mg/dL (ref 6–23)
Glucose, Bld: 92 mg/dL (ref 70–99)
Sodium: 139 mEq/L (ref 135–145)
Total Bilirubin: 0.7 mg/dL (ref 0.3–1.2)

## 2013-08-27 LAB — LIPID PANEL
LDL Cholesterol: 52 mg/dL (ref 0–99)
Total CHOL/HDL Ratio: 2
VLDL: 19 mg/dL (ref 0.0–40.0)

## 2013-08-27 NOTE — Assessment & Plan Note (Signed)
Stable with no major complaints. He does have increased micturition with activity.  Plan Continue present medical regimen

## 2013-08-27 NOTE — Assessment & Plan Note (Signed)
Has been doing well - symptom free. He sees Dr. Clifton James as needed.  Plan Continue risk reduction.

## 2013-08-27 NOTE — Progress Notes (Signed)
Subjective:    Patient ID: Wesley Song., male    DOB: 08-Aug-1930, 79 y.o.   MRN: [id number]  HPI The patient is here for annual Medicare wellness examination and management of other chronic and acute problems.  In the interval he has been doing well with no major illness, surgery, or injury.   The risk factors are reflected in the social history.  The roster of all physicians providing medical care to patient - is listed in the Snapshot section of the chart.  Activities of daily living:  The patient is 100% inedpendent in all ADLs: dressing, toileting, feeding as well as independent mobility  Home safety : The patient has smoke detectors in the home. Falls - no falls, home is fall safe. They wear seatbelts.  firearms are present in the home, kept in a safe fashion. There is no violence in the home.   There is no risks for hepatitis, STDs or HIV. There is no   history of blood transfusion. They have no travel history to infectious disease endemic areas of the world.  The patient has seen their dentist in the last six month. They have  seen their eye doctor in the last year. They admit to hearing difficulty and have not had audiologic testing in the last year.    They do not  have excessive sun exposure. Discussed the need for sun protection: hats, long sleeves and use of sunscreen if there is significant sun exposure.   Diet: the importance of a healthy diet is discussed. They do have a healthy diet.  The patient has a regular exercise program: walks/nautilus , 90 min duration, 5 days per week.  The benefits of regular aerobic exercise were discussed.  Depression screen: there are no signs or vegative symptoms of depression- irritability, change in appetite, anhedonia, sadness/tearfullness.  Cognitive assessment: the patient manages all their financial and personal affairs and is actively engaged.   The following portions of the patient's history were reviewed and updated as  appropriate: allergies, current medications, past family history, past medical history,  past surgical history, past social history  and problem list.  Past Medical History  Diagnosis Date  . HYPERLIPIDEMIA 01/07/2008  . CORONARY ARTERY DISEASE 01/07/2008  . Gross hematuria 05/23/2008  . BENIGN PROSTATIC HYPERTROPHY 01/07/2008  . FLANK PAIN, LEFT 01/05/2008  . Cataract   . Ischemic heart disease   . Hypokinesia    Past Surgical History  Procedure Laterality Date  . Hernia repair  1988    Right  . Nephrolithiasis  1992    w/treatment  . Cardiac catheterization  1999   Family History  Problem Relation Age of Onset  . Heart disease Brother   . Hyperlipidemia Brother   . Diabetes Neg Hx   . Colon cancer Neg Hx   . Lung cancer Other     1 sibling deceased  . Prostate cancer Father 72  . Polycythemia Mother 61  . Heart attack Brother 39   History   Social History  . Marital Status: Widowed    Spouse Name: N/A    Number of Children: N/A  . Years of Education: N/A   Occupational History  . Not on file.   Social History Main Topics  . Smoking status: Former Smoker    Types: Cigarettes    Quit date: 12/13/1968  . Smokeless tobacco: Not on file  . Alcohol Use: 3.5 oz/week    7 drink(s) per week  . Drug Use: No  .  Sexual Activity: No   Other Topics Concern  . Not on file   Social History Narrative   HSG.  [employer] 4 years. Married '63 - widowed '10. 1 son - '67. 3 grandchildren.  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 End of Life Care- patient has not considered this: he does indicate he is not sure abour resuscitation, he does not want to be maintained in a less functional state or want heroic measures.(May '12)    Current Outpatient Prescriptions on File Prior to Visit  Medication Sig Dispense Refill  . Alfalfa 650 MG TABS Take by mouth 1 day or 1 dose.       Marland Kitchen aspirin 325 MG tablet Take 325 mg by mouth daily.        Marland Kitchen atorvastatin (LIPITOR) 10 MG tablet Take 5 mg by mouth  daily.       Marland Kitchen atorvastatin (LIPITOR) 10 MG tablet TAKE 1 TABLET (10 MG TOTAL) BY MOUTH DAILY.  30 tablet  3  . calcium carbonate (OS-CAL) 600 MG TABS Take 600 mg by mouth daily.        . cholecalciferol (VITAMIN D) 400 UNITS TABS Take by mouth. VITAMIN D3 1 tab twice a day       . doxazosin (CARDURA) 2 MG tablet Take 2 mg by mouth at bedtime.        . magnesium 30 MG tablet Take 30 mg by mouth daily.        . Multiple Vitamin (MULTIVITAMIN) tablet Take 1 tablet by mouth daily.        . Omega-3 Fatty Acids (FISH OIL) 1000 MG CPDR Take by mouth 2 (two) times daily.        . Oxybutynin Chloride (GELNIQUE) 10 % GEL Place onto the skin.        Marland Kitchen psyllium (METAMUCIL SMOOTH TEXTURE) 28 % packet Take 1 packet by mouth 2 (two) times daily.        . vitamin B-12 (CYANOCOBALAMIN) 1000 MCG tablet Take 1,000 mcg by mouth daily.         No current facility-administered medications on file prior to visit.     Vision, hearing, body mass index were assessed and reviewed.   During the course of the visit the patient was educated and counseled about appropriate screening and preventive services including : fall prevention , diabetes screening, nutrition counseling, colorectal cancer screening, and recommended immunizations.    Review of Systems Constitutional:  Negative for fever, chills, activity change and unexpected weight change.  HEENT:  Negative for hearing loss, ear pain, congestion, neck stiffness and postnasal drip. Negative for sore throat or swallowing problems. Negative for dental complaints.   Eyes: Negative for vision loss or change in visual acuity.  Respiratory: Negative for chest tightness and wheezing. Negative for DOE.   Cardiovascular: Negative for chest pain or palpitations. No decreased exercise tolerance Gastrointestinal: No change in bowel habit. No bloating or gas. No reflux or indigestion Genitourinary: Negative for urgency, frequency, flank pain and difficulty urinating.   Musculoskeletal: Negative for myalgias, back pain, arthralgias and gait problem.  Neurological: Negative for dizziness, tremors, weakness and headaches.  Hematological: Negative for adenopathy.  Psychiatric/Behavioral: Negative for behavioral problems and dysphoric mood.       Objective:   Physical Exam Filed Vitals:   08/27/13 0848  BP: 120/54  Pulse: 65  Temp: 96.9 F (36.1 C)   Wt Readings from Last 3 Encounters:  08/27/13 177 lb 6.4 oz (80.468 kg)  11/03/12 173  lb 12.8 oz (78.835 kg)  06/26/12 174 lb (78.926 kg)   Gen'l: Well nourished well developed male in no acute distress  HEENT: Head: Normocephalic and atraumatic. Right Ear: External ear normal. EAC/TM nl. Left Ear: External ear normal.  EAC/TM nl. Nose: Nose normal. Mouth/Throat: Oropharynx is clear and moist. Dentition - native, in good repair. No buccal or palatal lesions. Posterior pharynx clear. Eyes: Conjunctivae and sclera clear. EOM intact. Pupils are equal, round, and reactive to light. Right eye exhibits no discharge. Left eye exhibits no discharge. Neck: Normal range of motion. Neck supple. No JVD present. No tracheal deviation present. No thyromegaly present.  Cardiovascular: Normal rate, regular rhythm, no gallop, no friction rub, no murmur heard.      Quiet precordium. 2+ radial and DP pulses . No carotid bruits Pulmonary/Chest: Effort normal. No respiratory distress or increased WOB, no wheezes, no rales. No chest wall deformity or CVAT. Abdomen: Soft. Bowel sounds are normal in all quadrants. He exhibits no distension, no tenderness, no rebound or guarding, No heptosplenomegaly  Genitourinary:   Musculoskeletal: Normal range of motion. He exhibits no edema and no tenderness.       Small and large joints without redness, synovial thickening or deformity. Full range of motion preserved about all small, median and large joints.  Lymphadenopathy:    He has no cervical or supraclavicular adenopathy.   Neurological: He is alert and oriented to person, place, and time. CN II-XII intact. DTRs 2+ and symmetrical biceps, radial and patellar tendons. Cerebellar function normal with no tremor, rigidity, normal gait and station.  Skin: Skin is warm and dry. No rash noted. No erythema.  Psychiatric: He has a normal mood and affect. His behavior is normal. Thought content normal.   Recent Results (from the past 2160 hour(s))  HEPATIC FUNCTION PANEL     Status: None   Collection Time    08/27/13  9:54 AM      Result Value Range   Total Bilirubin 0.7  0.3 - 1.2 mg/dL   Bilirubin, Direct 0.2  0.0 - 0.3 mg/dL   Alkaline Phosphatase 59  39 - 117 U/L   AST 22  0 - 37 U/L   ALT 26  0 - 53 U/L   Total Protein 7.0  6.0 - 8.3 g/dL   Albumin 4.1  3.5 - 5.2 g/dL  COMPREHENSIVE METABOLIC PANEL     Status: None   Collection Time    08/27/13  9:54 AM      Result Value Range   Sodium 139  135 - 145 mEq/L   Potassium 3.8  3.5 - 5.1 mEq/L   Chloride 107  96 - 112 mEq/L   CO2 27  19 - 32 mEq/L   Glucose, Bld 92  70 - 99 mg/dL   BUN 16  6 - 23 mg/dL   Creatinine, Ser 1.1  0.4 - 1.5 mg/dL   Total Bilirubin 0.7  0.3 - 1.2 mg/dL   Alkaline Phosphatase 59  39 - 117 U/L   AST 22  0 - 37 U/L   ALT 26  0 - 53 U/L   Total Protein 7.0  6.0 - 8.3 g/dL   Albumin 4.1  3.5 - 5.2 g/dL   Calcium 9.2  8.4 - 11.9 mg/dL   GFR 14.78  >29.56 mL/min  LIPID PANEL     Status: None   Collection Time    08/27/13  9:54 AM      Result Value Range  Cholesterol 120  0 - 200 mg/dL   Comment: ATP III Classification       Desirable:  < 200 mg/dL               Borderline High:  200 - 239 mg/dL          High:  > = 161 mg/dL   Triglycerides 09.6  0.0 - 149.0 mg/dL   Comment: Normal:  <045 mg/dLBorderline High:  150 - 199 mg/dL   HDL 40.98  >11.91 mg/dL   VLDL 47.8  0.0 - 29.5 mg/dL   LDL Cholesterol 52  0 - 99 mg/dL   Total CHOL/HDL Ratio 2     Comment:                Men          Women1/2 Average Risk     3.4           3.3Average Risk          5.0          4.42X Average Risk          9.6          7.13X Average Risk          15.0          11.0                             Assessment & Plan:

## 2013-08-27 NOTE — Patient Instructions (Addendum)
Thanks for coming in.  Your exam is fine. Lab is ordered and you will receive a written report on the lab.  Immunizations: will give Tetanus and Prevnar - pneumonia vaccine today. You can return for a shingles vaccine and flu or get it at [facility] (be sure they send me a report).  I want you to get balder, not fatter but older. Come back when you need to or next year

## 2013-08-27 NOTE — Assessment & Plan Note (Signed)
Taking and tolerating medication. For lab today with recommendations to follow

## 2013-08-27 NOTE — Assessment & Plan Note (Signed)
Interval history is unremarkable. Physical exam is normal. He has aged out of colorectal and prostate cancer screening. Immunizations - tetanus and Prevnar today - he will get flu and shingles from [facility] at his convenience.  In summary - a nice man who is medically stable at this time.

## 2013-08-31 ENCOUNTER — Encounter: Payer: Self-pay | Admitting: Internal Medicine

## 2013-09-11 ENCOUNTER — Ambulatory Visit (INDEPENDENT_AMBULATORY_CARE_PROVIDER_SITE_OTHER): Payer: Medicare Other

## 2013-09-11 DIAGNOSIS — Z2911 Encounter for prophylactic immunotherapy for respiratory syncytial virus (RSV): Secondary | ICD-10-CM

## 2013-09-11 DIAGNOSIS — Z23 Encounter for immunization: Secondary | ICD-10-CM

## 2013-11-20 ENCOUNTER — Encounter: Payer: Self-pay | Admitting: Cardiovascular Disease

## 2013-11-20 ENCOUNTER — Ambulatory Visit (INDEPENDENT_AMBULATORY_CARE_PROVIDER_SITE_OTHER): Payer: Medicare Other | Admitting: Cardiovascular Disease

## 2013-11-20 VITALS — BP 124/61 | HR 53 | Ht 72.0 in | Wt 178.0 lb

## 2013-11-20 DIAGNOSIS — E785 Hyperlipidemia, unspecified: Secondary | ICD-10-CM

## 2013-11-20 DIAGNOSIS — I2510 Atherosclerotic heart disease of native coronary artery without angina pectoris: Secondary | ICD-10-CM

## 2013-11-20 NOTE — Progress Notes (Signed)
History of Present Illness: 79 yo WM with history of HLD, CAD, BPH here today for cardiac follow up. He has been followed in the past by Dr. Deborah Chalk. His cardiac history includes first angioplasty in 1990 in the circumflex artery, angioplasty RCA in 1995. His last cardiac catheterization was in 1999 with moderate disease in the diagonal vessel but minimal disease otherwise. Stress test In 2009 showed a fixed lateral defect with low normal EF and no evidence of ischemia.   He tells me that he has been feeling well. No chest pain or SOB. No dizziness, palpitations, near syncope or syncope. He works at the [employer] on Tuesday and Wednesday and exercise the other days at [employer].   Primary Care Physician: Dr. Debby Bud  Last Lipid Profile:Lipid Panel     Component Value Date/Time   CHOL 120 08/27/2013 0954   TRIG 95.0 08/27/2013 0954   HDL 48.80 08/27/2013 0954   CHOLHDL 2 08/27/2013 0954   VLDL 19.0 08/27/2013 0954   LDLCALC 52 08/27/2013 0954     Past Medical History  Diagnosis Date  . HYPERLIPIDEMIA 01/07/2008  . CORONARY ARTERY DISEASE 01/07/2008  . Gross hematuria 05/23/2008  . BENIGN PROSTATIC HYPERTROPHY 01/07/2008  . FLANK PAIN, LEFT 01/05/2008  . Cataract   . Ischemic heart disease   . Hypokinesia     Past Surgical History  Procedure Laterality Date  . Hernia repair  1988    Right  . Nephrolithiasis  1992    w/treatment  . Cardiac catheterization  1999    Current Outpatient Prescriptions  Medication Sig Dispense Refill  . Alfalfa 650 MG TABS Take by mouth 1 day or 1 dose.       Marland Kitchen aspirin 325 MG tablet Take 325 mg by mouth daily.        Marland Kitchen atorvastatin (LIPITOR) 10 MG tablet Take 5 mg by mouth daily.       . calcium carbonate (OS-CAL) 600 MG TABS Take 600 mg by mouth daily.        . cholecalciferol (VITAMIN D) 400 UNITS TABS Take by mouth. VITAMIN D3 1 tab twice a day       . doxazosin (CARDURA) 2 MG tablet Take 2 mg by mouth at bedtime.        . magnesium 30 MG  tablet Take 30 mg by mouth daily.        . Omega-3 Fatty Acids (FISH OIL) 1000 MG CPDR Take by mouth 2 (two) times daily.        . Oxybutynin Chloride (GELNIQUE) 10 % GEL Place onto the skin.        Marland Kitchen psyllium (METAMUCIL SMOOTH TEXTURE) 28 % packet Take 1 packet by mouth 2 (two) times daily.        . vitamin B-12 (CYANOCOBALAMIN) 1000 MCG tablet Take 1,000 mcg by mouth daily.         No current facility-administered medications for this visit.    No Known Allergies  History   Social History  . Marital Status: Widowed    Spouse Name: N/A    Number of Children: N/A  . Years of Education: N/A   Occupational History  . Not on file.   Social History Main Topics  . Smoking status: Former Smoker    Types: Cigarettes    Quit date: 12/13/1968  . Smokeless tobacco: Not on file  . Alcohol Use: 3.5 oz/week    7 drink(s) per week  . Drug Use: No  .  Sexual Activity: No   Other Topics Concern  . Not on file   Social History Narrative   HSG.  [employer] 4 years. Married '63 - widowed '10. 1 son - '67. 3 grandchildren.  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 End of Life Care- patient has not considered this: he does indicate he is not sure abour resuscitation, he does not want to be maintained in a less functional state or want heroic measures.(May '12)    Family History  Problem Relation Age of Onset  . Heart disease Brother   . Hyperlipidemia Brother   . Diabetes Neg Hx   . Colon cancer Neg Hx   . Lung cancer Other     1 sibling deceased  . Prostate cancer Father 63  . Polycythemia Mother 62  . Heart attack Brother 50    Review of Systems:  As stated in the HPI and otherwise negative.   BP 124/61  Pulse 53  Ht 6' (1.829 m)  Wt 178 lb (80.74 kg)  BMI 24.14 kg/m2  Physical Examination: General: Well developed, well nourished, NAD HEENT: OP clear, mucus membranes moist SKIN: warm, dry. No rashes. Neuro: No focal deficits Musculoskeletal: Muscle strength 5/5 all ext Psychiatric: Mood  and affect normal Neck: No JVD, no carotid bruits, no thyromegaly, no lymphadenopathy. Lungs:Clear bilaterally, no wheezes, rhonci, crackles Cardiovascular: Huston Foley, Regular rhythm. No murmurs, gallops or rubs. Abdomen:Soft. Bowel sounds present. Non-tender.  Extremities: No lower extremity edema. Pulses are 2 + in the bilateral DP/PT.  EKG: Sinus brady, rate 53 bpm. PACs. Poor R wave progression through the precordial leads.   Assessment and Plan:   1. CORONARY ARTERY DISEASE: Stable. Continue current therapy. His lipids and BP are well controlled. He is exercising every day. We discussed arranging a stress test for ischemic testing since has not undergone one since 2009 but he does not wish to do so at this time.   2. Hyperlipidemia: Continue statin. Lipids and LFTs updated September 2014.

## 2013-11-20 NOTE — Patient Instructions (Signed)
Your physician wants you to follow-up in: 12 months.  You will receive a reminder letter in the mail two months in advance. If you don't receive a letter, please call our office to schedule the follow-up appointment.  Your physician recommends that you continue on your current medications as directed. Please refer to the Current Medication list given to you today.   

## 2013-11-24 ENCOUNTER — Other Ambulatory Visit: Payer: Self-pay | Admitting: Cardiovascular Disease

## 2014-06-04 ENCOUNTER — Telehealth: Payer: Self-pay

## 2014-06-04 NOTE — Telephone Encounter (Signed)
Patient walked in office.Stated his insurance company was questioning lipitor dosage.Stated he is taking lipitor 10 mg 1/2 tablet daily.Advised at last visit 11/20/13 with Dr.McAlhany he was taking lipitor 10 mg 1/2 tablet daily and was advised to continue same medications.

## 2014-07-14 ENCOUNTER — Other Ambulatory Visit: Payer: Self-pay | Admitting: Cardiovascular Disease

## 2014-08-15 ENCOUNTER — Ambulatory Visit (INDEPENDENT_AMBULATORY_CARE_PROVIDER_SITE_OTHER): Payer: Medicare Other | Admitting: Internal Medicine

## 2014-08-15 ENCOUNTER — Other Ambulatory Visit (INDEPENDENT_AMBULATORY_CARE_PROVIDER_SITE_OTHER): Payer: Medicare Other

## 2014-08-15 ENCOUNTER — Encounter: Payer: Self-pay | Admitting: Internal Medicine

## 2014-08-15 VITALS — BP 112/70 | HR 52 | Temp 98.2°F | Resp 16 | Ht 73.0 in | Wt 175.0 lb

## 2014-08-15 DIAGNOSIS — Z0000 Encounter for general adult medical examination without abnormal findings: Secondary | ICD-10-CM

## 2014-08-15 DIAGNOSIS — I2510 Atherosclerotic heart disease of native coronary artery without angina pectoris: Secondary | ICD-10-CM

## 2014-08-15 DIAGNOSIS — E785 Hyperlipidemia, unspecified: Secondary | ICD-10-CM

## 2014-08-15 DIAGNOSIS — H9193 Unspecified hearing loss, bilateral: Secondary | ICD-10-CM

## 2014-08-15 DIAGNOSIS — N400 Enlarged prostate without lower urinary tract symptoms: Secondary | ICD-10-CM

## 2014-08-15 DIAGNOSIS — H9190 Unspecified hearing loss, unspecified ear: Secondary | ICD-10-CM | POA: Insufficient documentation

## 2014-08-15 LAB — BASIC METABOLIC PANEL
BUN: 19 mg/dL (ref 6–23)
CALCIUM: 9.5 mg/dL (ref 8.4–10.5)
CO2: 27 meq/L (ref 19–32)
CREATININE: 1.1 mg/dL (ref 0.4–1.5)
Chloride: 105 mEq/L (ref 96–112)
GFR: 67.35 mL/min (ref >60.00)
GLUCOSE: 88 mg/dL (ref 70–99)
Potassium: 3.9 mEq/L (ref 3.5–5.1)
SODIUM: 139 meq/L (ref 135–145)

## 2014-08-15 LAB — LIPID PANEL
CHOL/HDL RATIO: 2
Cholesterol: 110 mg/dL (ref 0–200)
HDL: 50.4 mg/dL (ref >39.00)
LDL Cholesterol: 47 mg/dL (ref 0–99)
NONHDL: 59.6
Triglycerides: 63 mg/dL (ref 0.0–149.0)
VLDL: 12.6 mg/dL (ref 0.0–40.0)

## 2014-08-15 NOTE — Progress Notes (Signed)
Pre visit review using our clinic review tool, if applicable. No additional management support is needed unless otherwise documented below in the visit note. 

## 2014-08-15 NOTE — Assessment & Plan Note (Signed)
He is wearing hearing aids at our encounter and he feels that he does well in social conversation with them without missing many details.

## 2014-08-15 NOTE — Assessment & Plan Note (Signed)
He will get flu shot in about a month [facility] and has completed pneumonia. Would not pursue colonoscopy at this time.

## 2014-08-15 NOTE — Patient Instructions (Signed)
You are doing great with your health. Don't forget to get your flu shot next month. Come back in 1 year if you are doing well and if you are having problems before then please call our office. Keep exercising as you are.   Exercise to Stay Healthy Exercise helps you become and stay healthy. EXERCISE IDEAS AND TIPS Choose exercises that:  You enjoy.  Fit into your day. You do not need to exercise really hard to be healthy. You can do exercises at a slow or medium level and stay healthy. You can:  Stretch before and after working out.  Try yoga, Pilates, or tai chi.  Lift weights.  Walk fast, swim, jog, run, climb stairs, bicycle, dance, or rollerskate.  Take aerobic classes. Exercises that burn about 150 calories:  Running 1  miles in 15 minutes.  Playing volleyball for 45 to 60 minutes.  Washing and waxing a car for 45 to 60 minutes.  Playing touch football for 45 minutes.  Walking 1  miles in 35 minutes.  Pushing a stroller 1  miles in 30 minutes.  Playing basketball for 30 minutes.  Raking leaves for 30 minutes.  Bicycling 5 miles in 30 minutes.  Walking 2 miles in 30 minutes.  Dancing for 30 minutes.  Shoveling snow for 15 minutes.  Swimming laps for 20 minutes.  Walking up stairs for 15 minutes.  Bicycling 4 miles in 15 minutes.  Gardening for 30 to 45 minutes.  Jumping rope for 15 minutes.  Washing windows or floors for 45 to 60 minutes. Document Released: 01/01/2011 Document Revised: 02/21/2012 Document Reviewed: 01/01/2011 [facility] Patient Information 2015 [place], [place]. This information is not intended to replace advice given to you by your health care provider. Make sure you discuss any questions you have with your health care provider.

## 2014-08-15 NOTE — Assessment & Plan Note (Signed)
Patient is doing well and continues to see cardiology. He continues to take an ASA daily and lipitor. His blood pressure is well controlled without medications and last EF was low normal.

## 2014-08-15 NOTE — Assessment & Plan Note (Signed)
Recheck lipid panel today. Previously well controlled with lipitor.

## 2014-08-15 NOTE — Progress Notes (Signed)
   Subjective:    Patient ID: Wesley Song., male    DOB: Oct 15, 1930, 80 y.o.   MRN: [id number]  HPI The patient is here to establish care and for routine care. He is a very pleasant 80 YO man with PMH of CAD, BPH, hyperlipidemia. He is doing well at home and does not have any chest pains at rest or activity. He does work 3 days a week and then exercises the remaining 2 days. He is a retired Paediatric nurse and feels that his health is good. He does not have SOB with activity and no abdominal pain. He denies any problems with constipation, diarrhea. He does have some urinary frequency which is unchanged and not a nuisance to him. He has not had any blood in his urine or stools. He has not had any balance problems at home and no falls. He does still drive and regularly follows with his eye doctor. He does have hearing aids and hears well with them.   Review of Systems  Constitutional: Negative for fever, activity change, appetite change and unexpected weight change.  HENT: Negative for congestion and rhinorrhea.   Eyes: Negative for pain, discharge and itching.  Respiratory: Negative for cough, chest tightness, shortness of breath and wheezing.   Cardiovascular: Negative for chest pain, palpitations and leg swelling.  Gastrointestinal: Negative for nausea, vomiting, abdominal pain, diarrhea, constipation and abdominal distention.  Musculoskeletal: Negative for arthralgias and back pain.  Skin: Negative for color change and wound.  Neurological: Negative for dizziness, weakness, light-headedness and headaches.       Objective:   Physical Exam  Nursing note and vitals reviewed. Constitutional: He is oriented to person, place, and time. He appears well-developed and well-nourished. No distress.  HENT:  Head: Normocephalic and atraumatic.  Eyes: EOM are normal.  Neck: Normal range of motion. Neck supple. No JVD present. No tracheal deviation present. No thyromegaly present.  Cardiovascular: Normal rate  and regular rhythm.   No murmur heard. Pulmonary/Chest: Effort normal and breath sounds normal. No respiratory distress. He has no wheezes. He has no rales.  Abdominal: Soft. Bowel sounds are normal. He exhibits no distension. There is no tenderness.  Musculoskeletal: Normal range of motion.  Neurological: He is alert and oriented to person, place, and time.  Skin: Skin is warm and dry. He is not diaphoretic.       Assessment & Plan:

## 2014-08-15 NOTE — Assessment & Plan Note (Signed)
Patient is using cardura with fairly good result. He does have more frequent urination with drinking water in the heat while working which is acceptable to him.

## 2014-12-23 ENCOUNTER — Ambulatory Visit: Payer: Medicare Other | Admitting: Cardiovascular Disease

## 2015-02-03 DIAGNOSIS — H11423 Conjunctival edema, bilateral: Secondary | ICD-10-CM | POA: Diagnosis not present

## 2015-02-03 DIAGNOSIS — H02402 Unspecified ptosis of left eyelid: Secondary | ICD-10-CM | POA: Diagnosis not present

## 2015-02-03 DIAGNOSIS — H02401 Unspecified ptosis of right eyelid: Secondary | ICD-10-CM | POA: Diagnosis not present

## 2015-02-13 ENCOUNTER — Encounter: Payer: Self-pay | Admitting: Cardiovascular Disease

## 2015-02-13 ENCOUNTER — Ambulatory Visit (INDEPENDENT_AMBULATORY_CARE_PROVIDER_SITE_OTHER): Payer: Medicare Other | Admitting: Cardiovascular Disease

## 2015-02-13 VITALS — BP 130/70 | HR 49 | Ht 72.25 in | Wt 173.2 lb

## 2015-02-13 DIAGNOSIS — E785 Hyperlipidemia, unspecified: Secondary | ICD-10-CM | POA: Diagnosis not present

## 2015-02-13 DIAGNOSIS — I2510 Atherosclerotic heart disease of native coronary artery without angina pectoris: Secondary | ICD-10-CM

## 2015-02-13 NOTE — Progress Notes (Signed)
History of Present Illness: 81 yo WM with history of HLD, CAD, BPH here today for cardiac follow up. He has been followed in the past by Dr. Deborah Chalk. His cardiac history includes first angioplasty in 1990 in the circumflex artery, angioplasty RCA in 1995. His last cardiac catheterization was in 1999 with moderate disease in the diagonal vessel but minimal disease otherwise. Stress test In 2009 showed a fixed lateral defect with low normal EF and no evidence of ischemia.   He tells me that he has been feeling well. No chest pain or SOB. No dizziness, palpitations, near syncope or syncope. He works at the [employer] on Tuesday and Wednesday and exercises the other days at [employer]. He enjoys walking and works out on the [employer].   Primary Care Physician: Genella Mech  Last Lipid Profile:Lipid Panel     Component Value Date/Time   CHOL 110 08/15/2014 0838   TRIG 63.0 08/15/2014 0838   HDL 50.40 08/15/2014 0838   CHOLHDL 2 08/15/2014 0838   VLDL 12.6 08/15/2014 0838   LDLCALC 47 08/15/2014 0838     Past Medical History  Diagnosis Date  . HYPERLIPIDEMIA 01/07/2008  . CORONARY ARTERY DISEASE 01/07/2008  . Gross hematuria 05/23/2008  . BENIGN PROSTATIC HYPERTROPHY 01/07/2008  . FLANK PAIN, LEFT 01/05/2008  . Cataract   . Ischemic heart disease   . Hypokinesia     Past Surgical History  Procedure Laterality Date  . Hernia repair  1988    Right  . Nephrolithiasis  1992    w/treatment  . Cardiac catheterization  1999    Current Outpatient Prescriptions  Medication Sig Dispense Refill  . Alfalfa 650 MG TABS Take by mouth 1 day or 1 dose.     Marland Kitchen aspirin 325 MG tablet Take 325 mg by mouth daily.      Marland Kitchen atorvastatin (LIPITOR) 10 MG tablet TAKE 0.5 TABLETS (5 MG TOTAL) BY MOUTH DAILY. 30 tablet 3  . calcium carbonate (OS-CAL) 600 MG TABS Take 600 mg by mouth daily.      . cholecalciferol (VITAMIN D) 400 UNITS TABS Take by mouth. VITAMIN D3 1 tab twice a day     .  doxazosin (CARDURA) 2 MG tablet Take 2 mg by mouth at bedtime.      . magnesium 30 MG tablet Take 30 mg by mouth daily.      . Omega-3 Fatty Acids (FISH OIL) 1000 MG CPDR Take by mouth 2 (two) times daily.      . Oxybutynin Chloride (GELNIQUE) 10 % GEL Place onto the skin.      Marland Kitchen psyllium (METAMUCIL SMOOTH TEXTURE) 28 % packet Take 1 packet by mouth 2 (two) times daily.      . vitamin B-12 (CYANOCOBALAMIN) 1000 MCG tablet Take 1,000 mcg by mouth daily.       No current facility-administered medications for this visit.    No Known Allergies  History   Social History  . Marital Status: Widowed    Spouse Name: N/A  . Number of Children: N/A  . Years of Education: N/A   Occupational History  . Not on file.   Social History Main Topics  . Smoking status: Former Smoker    Types: Cigarettes    Quit date: 12/13/1968  . Smokeless tobacco: Not on file  . Alcohol Use: 3.5 oz/week    7 drink(s) per week  . Drug Use: No  . Sexual Activity: No   Other Topics Concern  .  Not on file   Social History Narrative   HS[employer].  [employer] 4 years. Married '63 - widowed '10. 1 son - '67. 3 grandchildren.  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 End of Life Care- patient has not considered this: he does indicate he is not sure abour resuscitation, he does not want to be maintained in a less functional state or want heroic measures.(May '12)    Family History  Problem Relation Age of Onset  . Heart disease Brother   . Hyperlipidemia Brother   . Diabetes Neg Hx   . Colon cancer Neg Hx   . Lung cancer Other     1 sibling deceased  . Prostate cancer Father 85  . Polycythemia Mother 44  . Heart attack Brother 50    Review of Systems:  As stated in the HPI and otherwise negative.   BP 130/70 mmHg  Pulse 49  Ht 6' 0.25" (1.835 m)  Wt 173 lb 3.2 oz (78.563 kg)  BMI 23.33 kg/m2  Physical Examination: General: Well developed, well nourished, NAD HEENT: OP clear, mucus membranes moist SKIN: warm, dry. No  rashes. Neuro: No focal deficits Musculoskeletal: Muscle strength 5/5 all ext Psychiatric: Mood and affect normal Neck: No JVD, no carotid bruits, no thyromegaly, no lymphadenopathy. Lungs:Clear bilaterally, no wheezes, rhonci, crackles Cardiovascular: Huston Foley, Regular rhythm. No murmurs, gallops or rubs. Abdomen:Soft. Bowel sounds present. Non-tender.  Extremities: No lower extremity edema. Pulses are 2 + in the bilateral DP/PT.  EKG: Sinus bradycardia, rate 49 bpm.   Assessment and Plan:   1. CORONARY ARTERY DISEASE: Stable. Continue current therapy. His lipids and BP are well controlled. He is exercising every day. We discussed arranging a stress test for ischemic testing since has not undergone one since 2009 but he does not wish to do so at this time.   2. Hyperlipidemia: Continue statin. Lipids and LFTs updated September 2014.

## 2015-02-13 NOTE — Patient Instructions (Signed)
Your physician wants you to follow-up in:  12 months.  You will receive a reminder letter in the mail two months in advance. If you don't receive a letter, please call our office to schedule the follow-up appointment.   

## 2015-02-24 DIAGNOSIS — H53483 Generalized contraction of visual field, bilateral: Secondary | ICD-10-CM | POA: Diagnosis not present

## 2015-02-27 ENCOUNTER — Other Ambulatory Visit: Payer: Self-pay | Admitting: Cardiovascular Disease

## 2015-03-10 DIAGNOSIS — H02401 Unspecified ptosis of right eyelid: Secondary | ICD-10-CM | POA: Diagnosis not present

## 2015-03-10 DIAGNOSIS — H02402 Unspecified ptosis of left eyelid: Secondary | ICD-10-CM | POA: Diagnosis not present

## 2015-03-10 DIAGNOSIS — H5340 Unspecified visual field defects: Secondary | ICD-10-CM | POA: Diagnosis not present

## 2015-03-10 DIAGNOSIS — H02403 Unspecified ptosis of bilateral eyelids: Secondary | ICD-10-CM | POA: Diagnosis not present

## 2015-08-04 ENCOUNTER — Telehealth: Payer: Self-pay

## 2015-08-04 NOTE — Telephone Encounter (Signed)
Call to educate on AWV to see if the patient can come in for AWV prior to his CPE on 9.8/ the patient agreed to come in 8/26 for AWV at 11am

## 2015-08-08 ENCOUNTER — Ambulatory Visit: Payer: Medicare Other

## 2015-08-14 ENCOUNTER — Ambulatory Visit (INDEPENDENT_AMBULATORY_CARE_PROVIDER_SITE_OTHER): Payer: Medicare Other

## 2015-08-14 VITALS — BP 104/74 | Ht 72.5 in | Wt 173.5 lb

## 2015-08-14 DIAGNOSIS — Z23 Encounter for immunization: Secondary | ICD-10-CM | POA: Diagnosis not present

## 2015-08-14 DIAGNOSIS — Z0000 Encounter for general adult medical examination without abnormal findings: Secondary | ICD-10-CM | POA: Diagnosis not present

## 2015-08-14 NOTE — Progress Notes (Signed)
Subjective:   Wesley [name]. is a 81 y.o. male who presents for an Initial Medicare Annual Wellness Visit.  Review of Systems   HRA assessment completed during visit; [place] Patient is here for Annual Wellness Assessment The Patient was informed that this wellness visit is to identify risk and educate on how to reduce risk for increase disease through lifestyle changes.  The patient verbalized understanding that any voiced medical issues still need to be directed to their physician.     Problem list review for risk CAD; hyperlipidemia/no chest pain or issues since 2001  Right angioplasty in 1995 Lipids 08/2014; HDL 50.4; Trig 63; Ratio 2;  BMI: Normal at 23.5 Diet; Eat a little; up at 5am; eat breakfast; bowl of cereal; sandwich for lunch; Eat chicken or other at hs; vegetables; Cooks his own food   Exercise; at gym; goes to [employer] in [place]; walks x 1 mile and works out 45 minutes / also keeps the [employer]  No hx of falls; "wobbles on occasion" but takes med for ringing in ears; Drinks a lot of water;   Also sees [place] doctor; just goes to [place] for hearing aid He is not seen at the [place] otherwise  Family Hx  Brother heart disease; hyperlipidemia; lived to be 56;  Father had prostate cancer; mother polycythemia;  Brother had MI  Wife deceased 2009-12-18 One son in [place]; 3 grand children;  Does have brother 4 years older that he can depend  Personalized education given regarding risk   Meds reviewed ; Takes alfalfa helps him not to go to the bathroom as much Poor memory regarding doxazosin; hx of BPH but finally states he takes 2 mg at hs;   Social history: wife deceased; 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 Now hears ringing in ears;   SAFETY Home is one level; Wanted to sell his home and is to big to keep up, but son wants him to keep it. 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 reviewed/ Remove clutter and tripping hazards, railings on stairs and grab bars in the bathroom; good lighting; annual eye exam; review of medications  No record of falls;  Falls assessed; mobility changes; balance changes;   Urinary or fecal incontinence; no issues  Educated on prevention falls; Exercises currently, toning and strengthening; Balance exercises   Home Safety Tips for Older Adults given  Review Firearm safety as appropriate;  Smoke detectors in home Assessed for community safety; good Emergency Plan for illness or other Driving: seatbelt; no accidents Sun protection; discussed    Caregiver to other? no  Discussed Goal to improve health based on risk to continue to exercise and stay mobile  Screenings overdue Ophthalmology exam; end of 2015; Did "eyelid operation"  Immunizations; 2/2 PSV 23 and Flu shot today Other vaccinations up to date Shingles 08/2013 Colonoscopy; was about 72 when he had one; aged out now; states it was normal EKG 02/13/2015 Hearing: hearing aids both ears Dental: just had 2 front teeth; crown; Caught up on dental work now  [employer] on safety to take home;   Current Care Team reviewed and updated Cardiac Risk Factors include: advanced age (>74men, >55 women);dyslipidemia;family history of premature cardiovascular disease;male gender    Objective:    Today's Vitals   08/14/15  1117  BP: 104/74  Height: 6' 0.5" (1.842 m)  Weight: 173 lb 8 oz (78.699 kg)    Current Medications (verified) Outpatient Encounter Prescriptions as of 08/14/2015  Medication Sig  . Alfalfa 650 MG TABS Take by mouth 1 day or 1 dose.   Marland Kitchen aspirin 325 MG tablet Take 325 mg by mouth daily.    Marland Kitchen atorvastatin (LIPITOR) 10 MG tablet TAKE 0.5 TABLETS (5 MG TOTAL) BY MOUTH DAILY.  . calcium carbonate (OS-CAL) 600 MG TABS Take 600 mg by mouth daily.    . cholecalciferol (VITAMIN D) 400 UNITS TABS Take by  mouth. VITAMIN D3 1 tab twice a day   . doxazosin (CARDURA) 2 MG tablet Take 2 mg by mouth at bedtime.    . magnesium 30 MG tablet Take 30 mg by mouth daily.    . Omega-3 Fatty Acids (FISH OIL) 1000 MG CPDR Take by mouth 2 (two) times daily.    . psyllium (METAMUCIL SMOOTH TEXTURE) 28 % packet Take 1 packet by mouth 2 (two) times daily.    . vitamin B-12 (CYANOCOBALAMIN) 1000 MCG tablet Take 1,000 mcg by mouth daily.    . Oxybutynin Chloride (GELNIQUE) 10 % GEL Place onto the skin.     No facility-administered encounter medications on file as of 08/14/2015.    Allergies (verified) Review of patient's allergies indicates no known allergies.   History: Past Medical History  Diagnosis Date  . HYPERLIPIDEMIA 01/07/2008  . CORONARY ARTERY DISEASE 01/07/2008  . Gross hematuria 05/23/2008  . BENIGN PROSTATIC HYPERTROPHY 01/07/2008  . FLANK PAIN, LEFT 01/05/2008  . Cataract   . Ischemic heart disease   . Hypokinesia    Past Surgical History  Procedure Laterality Date  . Hernia repair  1988    Right  . Nephrolithiasis  1992    w/treatment  . Cardiac catheterization  1999   Family History  Problem Relation Age of Onset  . Heart disease Brother   . Hyperlipidemia Brother   . Diabetes Neg Hx   . Colon cancer Neg Hx   . Lung cancer Other     1 sibling deceased  . Prostate cancer Father 51  . Polycythemia Mother 62  . Heart attack Brother 60   Social History   Occupational History  . Not on file.   Social History Main Topics  . Smoking status: Former Smoker    Types: Cigarettes    Quit date: 12/13/1968  . Smokeless tobacco: Not on file     Comment: quit smoking before he turned 47  . Alcohol Use: 4.2 oz/week    7 Standard drinks or equivalent per week  . Drug Use: No  . Sexual Activity: No   Tobacco Counseling Counseling given: Not Answered   Activities of Daily Living In your present state of health, do you have any difficulty performing the following activities:  08/14/2015  Hearing? (No Data)  Vision? N  Difficulty concentrating or making decisions? Y  Walking or climbing stairs? N  Dressing or bathing? N  Doing errands, shopping? N  Preparing Food and eating ? N  Using the Toilet? N  In the past six months, have you accidently leaked urine? N  Do you have problems with loss of bowel control? N  Managing your Medications? N  Managing your Finances? N  Housekeeping or managing your Housekeeping? N    Immunizations and Health Maintenance Immunization History  Administered Date(s) Administered  . Influenza,inj,Quad PF,36+ Mos 09/11/2013  . Pneumococcal Conjugate-13  08/27/2013  . Pneumococcal Polysaccharide-23 08/14/2015  . Tetanus 08/27/2013  . Zoster 09/11/2013   Health Maintenance Due  Topic Date Due  . COLONOSCOPY  04/20/1982  . INFLUENZA VACCINE  07/14/2015    Patient Care Team: Judie Bonus, MD as PCP - General (Internal Medicine) Ihor Gully, MD (Urology) Kathleene Hazel, MD as Consulting Physician (Cardiology)  Indicate any recent Medical Services you may have received from other than [facility] providers in the past year (date may be approximate).    Assessment:   This is a routine wellness examination for Ignacio.   Assessment included: Smoking reviewed and quit smoking at 47; States at the end of the exam that he drinks about on oz of red wine for his health every day; no more.   Reviewed meds; stated that he was not taking the doxazosin and then stated he was taking 2 mg at hs; did not have his meds here and forgot about night pill. Takes one but can't determine if this is correct; Memory issues inconsistent; very specific about most history but did have some inconsistencies evidenced by his MMSE:  Counting serial 3's from 20; 3 out of 5; no recall on 3 objects and missed on cuing; could not repeat phrase; instruction seemed difficulty; Is HOH  His functional ability appears to be better than score would  indicated; would need informat to review any ADL issues.  Labs were and fup visit noted with MD if labs are due to be re-drawn.  No Risk for hepatitis or high risk social behavior identified via hepatitis screen  Safety issues reviewed;  Depression screen negative;   Functional status reviewed; no losses in function x 1 year  End of life planning was discussed; wants to sell home eventually; agreed to take AD home to review    Hearing/Vision screen Hearing Screening Comments: Hearing aids  Dietary issues and exercise activities discussed:    Goals    . Exercise 150 minutes per week (moderate activity)     Will continue to go to the gym; stay healthy      Depression Screen PHQ 2/9 Scores 08/14/2015  PHQ - 2 Score 0    Fall Risk Fall Risk  08/14/2015  Falls in the past year? No    Cognitive Function: MMSE - Mini Mental State Exam 08/14/2015  Orientation to time 5  Orientation to Place 5  Registration 3  Attention/ Calculation 3  Recall 0  Language- name 2 objects 2  Language- repeat 0  Language- follow 3 step command 2  Language- read & follow direction 1  Write a sentence 1  Copy design 1  Total score 23    Screening Tests Health Maintenance  Topic Date Due  . COLONOSCOPY  04/20/1982  . INFLUENZA VACCINE  07/14/2015  . TETANUS/TDAP  08/28/2023  . ZOSTAVAX  Completed  . PNA vac Low Risk Adult  Completed        Plan:   Very nice and cooperative gentleman with some mixed memory issues noted on MMSE; but may be in part due to hearing; c/o of tinnitis post war.   Some memory issues but not consistent through the exam.  No failure of independent living at this time. Could write a sentence in detail; could describe his dental work without difficulty, but could not discuss his meds with the same accuracy.   During the course of the visit Laureano was educated and counseled about the following appropriate screening and preventive services:  Vaccines to include  Pneumoccal, Influenza, Hepatitis B, Td, Zostavax, HCV/   Took PSV 23 and high does flu today  Electrocardiogram 02/2015  Colorectal cancer screening/ aged out  Cardiovascular disease screening/ no issues   Diabetes screening/ n/a  Glaucoma screening/ at the end of 2015; Had ptosis repair this year; states vision is very good.  Nutrition counseling/ adequate  Prostate cancer screening/ deferred  Smoking cessation counseling/ reviewed  Patient Instructions (the written plan) were given to the patient.   Montine Circle, RN   08/14/2015

## 2015-08-14 NOTE — Patient Instructions (Addendum)
Mr. Wesley [name] , Thank you for taking time to come for your Medicare Wellness Visit. I appreciate your ongoing commitment to your health goals. Please review the following plan we discussed and let me know if I can assist you in the future.   Agreed to take high does flu shot today Will also take PSV 23    These are the goals we discussed: Goals    . Exercise 150 minutes per week (moderate activity)     Will continue to go to the gym; stay healthy       This is a list of the screening recommended for you and due dates:  Health Maintenance  Topic Date Due  . Colon Cancer Screening  04/20/1982  . Pneumonia vaccines (2 of 2 - PPSV23) 08/27/2014  . Flu Shot  07/14/2015  . Tetanus Vaccine  08/28/2023  . Shingles Vaccine  Completed     Fall Prevention and Home Safety Falls cause injuries and can affect all age groups. It is possible to prevent falls.  HOW TO PREVENT FALLS  Wear shoes with rubber soles that do not have an opening for your toes.  Keep the inside and outside of your house well lit.  Use night lights throughout your home.  Remove clutter from floors.  Clean up floor spills.  Remove throw rugs or fasten them to the floor with carpet tape.  Do not place electrical cords across pathways.  Put grab bars by your tub, shower, and toilet. Do not use towel bars as grab bars.  Put handrails on both sides of the stairway. Fix loose handrails.  Do not climb on stools or stepladders, if possible.  Do not wax your floors.  Repair uneven or unsafe sidewalks, walkways, or stairs.  Keep items you use a lot within reach.  Be aware of pets.  Keep emergency numbers next to the telephone.  Put smoke detectors in your home and near bedrooms. Ask your doctor what other things you can do to prevent falls. Document Released: 09/25/2009 Document Revised: 05/30/2012 Document Reviewed: 02/29/2012 [facility] Patient Information 2015 [place], [place]. This information is not intended  to replace advice given to you by your health care provider. Make sure you discuss any questions you have with your health care provider.  Health Maintenance A healthy lifestyle and preventative care can promote health and wellness.  Maintain regular health, dental, and eye exams.  Eat a healthy diet. Foods like vegetables, fruits, whole grains, low-fat dairy products, and lean protein foods contain the nutrients you need and are low in calories. Decrease your intake of foods high in solid fats, added sugars, and salt. Get information about a proper diet from your health care provider, if necessary.  Regular physical exercise is one of the most important things you can do for your health. Most adults should get at least 150 minutes of moderate-intensity exercise (any activity that increases your heart rate and causes you to sweat) each week. In addition, most adults need muscle-strengthening exercises on 2 or more days a week.   Maintain a healthy weight. The body mass index (BMI) is a screening tool to identify possible weight problems. It provides an estimate of body fat based on height and weight. Your health care provider can find your BMI and can help you achieve or maintain a healthy weight. For males 20 years and older:  A BMI below 18.5 is considered underweight.  A BMI of 18.5 to 24.9 is normal.  A BMI of 25  to 29.9 is considered overweight.  A BMI of 30 and above is considered obese.  Maintain normal blood lipids and cholesterol by exercising and minimizing your intake of saturated fat. Eat a balanced diet with plenty of fruits and vegetables. Blood tests for lipids and cholesterol should begin at age 32 and be repeated every 5 years. If your lipid or cholesterol levels are high, you are over age 25, or you are at high risk for heart disease, you may need your cholesterol levels checked more frequently.Ongoing high lipid and cholesterol levels should be treated with medicines if diet  and exercise are not working.  If you smoke, find out from your health care provider how to quit. If you do not use tobacco, do not start.  Lung cancer screening is recommended for adults aged 55-80 years who are at high risk for developing lung cancer because of a history of smoking. A yearly low-dose CT scan of the lungs is recommended for people who have at least a 30-pack-year history of smoking and are current smokers or have quit within the past 15 years. A pack year of smoking is smoking an average of 1 pack of cigarettes a day for 1 year (for example, a 30-pack-year history of smoking could mean smoking 1 pack a day for 30 years or 2 packs a day for 15 years). Yearly screening should continue until the smoker has stopped smoking for at least 15 years. Yearly screening should be stopped for people who develop a health problem that would prevent them from having lung cancer treatment.  If you choose to drink alcohol, do not have more than 2 drinks per day. One drink is considered to be 12 oz (360 mL) of beer, 5 oz (150 mL) of wine, or 1.5 oz (45 mL) of liquor.  Avoid the use of street drugs. Do not share needles with anyone. Ask for help if you need support or instructions about stopping the use of drugs.  High blood pressure causes heart disease and increases the risk of stroke. Blood pressure should be checked at least every 1-2 years. Ongoing high blood pressure should be treated with medicines if weight loss and exercise are not effective.  If you are 61-89 years old, ask your health care provider if you should take aspirin to prevent heart disease.  Diabetes screening involves taking a blood sample to check your fasting blood sugar level. This should be done once every 3 years after age 31 if you are at a normal weight and without risk factors for diabetes. Testing should be considered at a younger age or be carried out more frequently if you are overweight and have at least 1 risk factor  for diabetes.  Colorectal cancer can be detected and often prevented. Most routine colorectal cancer screening begins at the age of 31 and continues through age 68. However, your health care provider may recommend screening at an earlier age if you have risk factors for colon cancer. On a yearly basis, your health care provider may provide home test kits to check for hidden blood in the stool. A small camera at the end of a tube may be used to directly examine the colon (sigmoidoscopy or colonoscopy) to detect the earliest forms of colorectal cancer. Talk to your health care provider about this at age 75 when routine screening begins. A direct exam of the colon should be repeated every 5-10 years through age 24, unless early forms of precancerous polyps or small growths are  found.  People who are at an increased risk for hepatitis B should be screened for this virus. You are considered at high risk for hepatitis B if:  You were born in a country where hepatitis B occurs often. Talk with your health care provider about which countries are considered high risk.  Your parents were born in a high-risk country and you have not received a shot to protect against hepatitis B (hepatitis B vaccine).  You have HIV or AIDS.  You use needles to inject street drugs.  You live with, or have sex with, someone who has hepatitis B.  You are a man who has sex with other men (MSM).  You get hemodialysis treatment.  You take certain medicines for conditions like cancer, organ transplantation, and autoimmune conditions.  Hepatitis C blood testing is recommended for all people born from [birth year] through 1963 and any individual with known risk factors for hepatitis C.  Healthy men should no longer receive prostate-specific antigen (PSA) blood tests as part of routine cancer screening. Talk to your health care provider about prostate cancer screening.  Testicular cancer screening is not recommended for adolescents or  adult males who have no symptoms. Screening includes self-exam, a health care provider exam, and other screening tests. Consult with your health care provider about any symptoms you have or any concerns you have about testicular cancer.  Practice safe sex. Use condoms and avoid high-risk sexual practices to reduce the spread of sexually transmitted infections (STIs).  You should be screened for STIs, including gonorrhea and chlamydia if:  You are sexually active and are younger than 24 years.  You are older than 24 years, and your health care provider tells you that you are at risk for this type of infection.  Your sexual activity has changed since you were last screened, and you are at an increased risk for chlamydia or gonorrhea. Ask your health care provider if you are at risk.  If you are at risk of being infected with HIV, it is recommended that you take a prescription medicine daily to prevent HIV infection. This is called pre-exposure prophylaxis (PrEP). You are considered at risk if:  You are a man who has sex with other men (MSM).  You are a heterosexual man who is sexually active with multiple partners.  You take drugs by injection.  You are sexually active with a partner who has HIV.  Talk with your health care provider about whether you are at high risk of being infected with HIV. If you choose to begin PrEP, you should first be tested for HIV. You should then be tested every 3 months for as long as you are taking PrEP.  Use sunscreen. Apply sunscreen liberally and repeatedly throughout the day. You should seek shade when your shadow is shorter than you. Protect yourself by wearing long sleeves, pants, a wide-brimmed hat, and sunglasses year round whenever you are outdoors.  Tell your health care provider of new moles or changes in moles, especially if there is a change in shape or color. Also, tell your health care provider if a mole is larger than the size of a pencil  eraser.  A one-time screening for abdominal aortic aneurysm (AAA) and surgical repair of large AAAs by ultrasound is recommended for men aged 65-75 years who are current or former smokers.  Stay current with your vaccines (immunizations). Document Released: 05/27/2008 Document Revised: 12/04/2013 Document Reviewed: 04/26/2011 [facility] Patient Information 2015 [place], [place]. This information is not  intended to replace advice given to you by your health care provider. Make sure you discuss any questions you have with your health care provider.

## 2015-08-21 ENCOUNTER — Encounter: Payer: Medicare Other | Admitting: Internal Medicine

## 2015-08-21 ENCOUNTER — Other Ambulatory Visit (INDEPENDENT_AMBULATORY_CARE_PROVIDER_SITE_OTHER): Payer: Medicare Other

## 2015-08-21 ENCOUNTER — Ambulatory Visit (INDEPENDENT_AMBULATORY_CARE_PROVIDER_SITE_OTHER): Payer: Medicare Other | Admitting: Internal Medicine

## 2015-08-21 ENCOUNTER — Encounter: Payer: Self-pay | Admitting: Internal Medicine

## 2015-08-21 VITALS — BP 110/62 | HR 57 | Temp 97.8°F | Resp 16 | Ht 73.0 in | Wt 173.4 lb

## 2015-08-21 DIAGNOSIS — H9193 Unspecified hearing loss, bilateral: Secondary | ICD-10-CM | POA: Diagnosis not present

## 2015-08-21 DIAGNOSIS — E785 Hyperlipidemia, unspecified: Secondary | ICD-10-CM

## 2015-08-21 DIAGNOSIS — N400 Enlarged prostate without lower urinary tract symptoms: Secondary | ICD-10-CM

## 2015-08-21 LAB — COMPREHENSIVE METABOLIC PANEL
ALBUMIN: 4.1 g/dL (ref 3.5–5.2)
ALK PHOS: 59 U/L (ref 39–117)
ALT: 21 U/L (ref 0–53)
AST: 18 U/L (ref 0–37)
BUN: 20 mg/dL (ref 6–23)
CO2: 29 mEq/L (ref 19–32)
CREATININE: 1.26 mg/dL (ref 0.40–1.50)
Calcium: 9.2 mg/dL (ref 8.4–10.5)
Chloride: 106 mEq/L (ref 96–112)
GFR: 58.04 mL/min — ABNORMAL LOW (ref >60.00)
Glucose, Bld: 84 mg/dL (ref 70–99)
POTASSIUM: 4.2 meq/L (ref 3.5–5.1)
Sodium: 140 mEq/L (ref 135–145)
TOTAL PROTEIN: 6.7 g/dL (ref 6.0–8.3)
Total Bilirubin: 0.5 mg/dL (ref 0.2–1.2)

## 2015-08-21 LAB — CBC
HCT: 40.6 % (ref 39.0–52.0)
Hemoglobin: 13.5 g/dL (ref 13.0–17.0)
MCHC: 33.3 g/dL (ref 30.0–36.0)
MCV: 91.9 fl (ref 78.0–100.0)
Platelets: 242 10*3/uL (ref 150.0–400.0)
RBC: 4.42 Mil/uL (ref 4.22–5.81)
RDW: 14.1 % (ref 11.5–15.5)
WBC: 8.6 10*3/uL (ref 4.0–10.5)

## 2015-08-21 LAB — LIPID PANEL
CHOLESTEROL: 111 mg/dL (ref 0–200)
HDL: 47.5 mg/dL (ref >39.00)
LDL Cholesterol: 40 mg/dL (ref 0–99)
NonHDL: 63.58
Total CHOL/HDL Ratio: 2
Triglycerides: 119 mg/dL (ref 0.0–149.0)
VLDL: 23.8 mg/dL (ref 0.0–40.0)

## 2015-08-21 NOTE — Patient Instructions (Addendum)
We will check the blood work today and call you back about the results.   You are doing well and keep up the good work with your health.   Fall Prevention and Home Safety Falls cause injuries and can affect all age groups. It is possible to use preventive measures to significantly decrease the likelihood of falls. There are many simple measures which can make your home safer and prevent falls. OUTDOORS  Repair cracks and edges of walkways and driveways.  Remove high doorway thresholds.  Trim shrubbery on the main path into your home.  Have good outside lighting.  Clear walkways of tools, rocks, debris, and clutter.  Check that handrails are not broken and are securely fastened. Both sides of steps should have handrails.  Have leaves, snow, and ice cleared regularly.  Use sand or salt on walkways during winter months.  In the garage, clean up grease or oil spills. BATHROOM  Install night lights.  Install grab bars by the toilet and in the tub and shower.  Use non-skid mats or decals in the tub or shower.  Place a plastic non-slip stool in the shower to sit on, if needed.  Keep floors dry and clean up all water on the floor immediately.  Remove soap buildup in the tub or shower on a regular basis.  Secure bath mats with non-slip, double-sided rug tape.  Remove throw rugs and tripping hazards from the floors. BEDROOMS  Install night lights.  Make sure a bedside light is easy to reach.  Do not use oversized bedding.  Keep a telephone by your bedside.  Have a firm chair with side arms to use for getting dressed.  Remove throw rugs and tripping hazards from the floor. KITCHEN  Keep handles on pots and pans turned toward the center of the stove. Use back burners when possible.  Clean up spills quickly and allow time for drying.  Avoid walking on wet floors.  Avoid hot utensils and knives.  Position shelves so they are not too high or low.  Place commonly  used objects within easy reach.  If necessary, use a sturdy step stool with a grab bar when reaching.  Keep electrical cables out of the way.  Do not use floor polish or wax that makes floors slippery. If you must use wax, use non-skid floor wax.  Remove throw rugs and tripping hazards from the floor. STAIRWAYS  Never leave objects on stairs.  Place handrails on both sides of stairways and use them. Fix any loose handrails. Make sure handrails on both sides of the stairways are as long as the stairs.  Check carpeting to make sure it is firmly attached along stairs. Make repairs to worn or loose carpet promptly.  Avoid placing throw rugs at the top or bottom of stairways, or properly secure the rug with carpet tape to prevent slippage. Get rid of throw rugs, if possible.  Have an electrician put in a light switch at the top and bottom of the stairs. OTHER FALL PREVENTION TIPS  Wear low-heel or rubber-soled shoes that are supportive and fit well. Wear closed toe shoes.  When using a stepladder, make sure it is fully opened and both spreaders are firmly locked. Do not climb a closed stepladder.  Add color or contrast paint or tape to grab bars and handrails in your home. Place contrasting color strips on first and last steps.  Learn and use mobility aids as needed. Install an electrical emergency response system.  Turn  on lights to avoid dark areas. Replace light bulbs that burn out immediately. Get light switches that glow.  Arrange furniture to create clear pathways. Keep furniture in the same place.  Firmly attach carpet with non-skid or double-sided tape.  Eliminate uneven floor surfaces.  Select a carpet pattern that does not visually hide the edge of steps.  Be aware of all pets. OTHER HOME SAFETY TIPS  Set the water temperature for 120 F (48.8 C).  Keep emergency numbers on or near the telephone.  Keep smoke detectors on every level of the home and near sleeping  areas. Document Released: 11/19/2002 Document Revised: 05/30/2012 Document Reviewed: 02/18/2012 [facility] Patient Information 2015 [place], [place]. This information is not intended to replace advice given to you by your health care provider. Make sure you discuss any questions you have with your health care provider.

## 2015-08-21 NOTE — Progress Notes (Signed)
Pre visit review using our clinic review tool, if applicable. No additional management support is needed unless otherwise documented below in the visit note. 

## 2015-08-21 NOTE — Assessment & Plan Note (Signed)
Currently on lipitor and checking liver enzymes and lipid panel today. No side effects. Adjust as indicated.

## 2015-08-21 NOTE — Assessment & Plan Note (Signed)
Uses hearing aids and still some mild impairment in hearing.

## 2015-08-21 NOTE — Progress Notes (Signed)
   Subjective:    Patient ID: Wesley Song., male    DOB: [birth year], 81 y.o.   MRN: [id number]  HPI The patient is an 81 YO man coming in for follow up of his chronic medical conditions. Please see A/P for details of status and treatment plan. Still works at [employer] and is more tired lately while working. Still able to do that. Denies chest pains, SOB, weight change. No new concerns. Denies change to his memory or signs of depression or anxiety.   PMH, [facility], social history reviewed and updated.   Review of Systems  Constitutional: Negative for fever, activity change, appetite change and unexpected weight change.  HENT: Negative for congestion and rhinorrhea.   Eyes: Negative for pain, discharge and itching.  Respiratory: Negative for cough, chest tightness, shortness of breath and wheezing.   Cardiovascular: Negative for chest pain, palpitations and leg swelling.  Gastrointestinal: Negative for nausea, vomiting, abdominal pain, diarrhea, constipation and abdominal distention.  Musculoskeletal: Negative for back pain and arthralgias.  Skin: Negative for color change and wound.  Neurological: Negative for dizziness, weakness, light-headedness and headaches.      Objective:   Physical Exam  Constitutional: He is oriented to person, place, and time. He appears well-developed and well-nourished. No distress.  HENT:  Head: Normocephalic and atraumatic.  Eyes: EOM are normal.  Neck: Normal range of motion. Neck supple. No JVD present. No tracheal deviation present. No thyromegaly present.  Cardiovascular: Normal rate and regular rhythm.   No murmur heard. Pulmonary/Chest: Effort normal and breath sounds normal. No respiratory distress. He has no wheezes. He has no rales.  Abdominal: Soft. Bowel sounds are normal. He exhibits no distension. There is no tenderness.  Musculoskeletal: Normal range of motion.  Neurological: He is alert and oriented to person, place, and time.  Skin:  Skin is warm and dry. He is not diaphoretic.  Nursing note and vitals reviewed.  Filed Vitals:   08/21/15 1019  BP: 110/62  Pulse: 57  Temp: 97.8 F (36.6 C)  TempSrc: Oral  Resp: 16  Height: 6\' 1"  (1.854 m)  Weight: 173 lb 6.4 oz (78.654 kg)  SpO2: 98%      Assessment & Plan:

## 2015-08-21 NOTE — Assessment & Plan Note (Signed)
Continue with proscar and he is still urinating several times a night but this is acceptable to him. No side effects, BP at goal.

## 2016-01-22 ENCOUNTER — Other Ambulatory Visit: Payer: Self-pay | Admitting: Cardiovascular Disease

## 2016-01-23 ENCOUNTER — Other Ambulatory Visit: Payer: Self-pay | Admitting: Cardiovascular Disease

## 2016-02-12 NOTE — Progress Notes (Signed)
Cardiology Office Note:    Date:  02/13/2016   ID:  Wesley [name]., DOB 05-27-1930, MRN [id number]  PCP:  Wesley Broker, MD  Cardiologist:  Dr. Verne Carrow   Electrophysiologist:  n/a  Chief Complaint  Patient presents with  . Coronary Artery Disease    Follow up    History of Present Illness:     Wesley [name]. is a 82 y.o. male with a hx of CAD, HL, BPH. Previous he followed by Dr. Deborah [name]. He underwent angioplasty to the LCx 1990, angioplasty to the RCA in 1995. LHC in 1999 with moderate disease in the diagonal minimal disease otherwise. Last seen by Dr. Clifton [name] 3/16. He returns for follow-up.  He is doing well.  The patient denies chest pain, shortness of breath, syncope, orthopnea, PND or significant pedal edema.    Past Medical History  Diagnosis Date  . HYPERLIPIDEMIA 01/07/2008  . CORONARY ARTERY DISEASE 01/07/2008  . Gross hematuria 05/23/2008  . BENIGN PROSTATIC HYPERTROPHY 01/07/2008  . FLANK PAIN, LEFT 01/05/2008  . Cataract   . Ischemic heart disease   . Hypokinesia     Past Surgical History  Procedure Laterality Date  . Hernia repair  1988    Right  . Nephrolithiasis  1992    w/treatment  . Cardiac catheterization  1999    Current Medications: Outpatient Prescriptions Prior to Visit  Medication Sig Dispense Refill  . Alfalfa 650 MG TABS Take by mouth 1 day or 1 dose.     Marland Kitchen aspirin 325 MG tablet Take 325 mg by mouth daily.      . calcium carbonate (OS-CAL) 600 MG TABS Take 600 mg by mouth daily.      . magnesium 30 MG tablet Take 30 mg by mouth daily.      . Oxybutynin Chloride (GELNIQUE) 10 % GEL Place onto the skin.      Marland Kitchen psyllium (METAMUCIL SMOOTH TEXTURE) 28 % packet Take 1 packet by mouth 2 (two) times daily.      . vitamin B-12 (CYANOCOBALAMIN) 1000 MCG tablet Take 1,000 mcg by mouth daily.      Marland Kitchen atorvastatin (LIPITOR) 10 MG tablet TAKE 0.5 TABLETS (5 MG TOTAL) BY MOUTH DAILY. (Patient not taking: Reported on 02/13/2016) 15 tablet 0    . cholecalciferol (VITAMIN D) 400 UNITS TABS Take by mouth. Reported on 02/13/2016    . doxazosin (CARDURA) 2 MG tablet Take 2 mg by mouth at bedtime. Reported on 02/13/2016    . Omega-3 Fatty Acids (FISH OIL) 1000 MG CPDR Take by mouth 2 (two) times daily. Reported on 02/13/2016     No facility-administered medications prior to visit.     Allergies:   Review of patient's allergies indicates no known allergies.   Social History   Social History  . Marital Status: Widowed    Spouse Name: N/A  . Number of Children: N/A  . Years of Education: N/A   Social History Main Topics  . Smoking status: Former Smoker    Types: Cigarettes    Quit date: 12/13/1968  . Smokeless tobacco: None     Comment: quit smoking before he turned 47  . Alcohol Use: 4.2 oz/week    7 Standard drinks or equivalent per week  . Drug Use: No  . Sexual Activity: No   Other Topics Concern  . None   Social History Narrative   [employer].  [employer] 4 years. Married '63 - widowed '10. 1 son - '67. 3 grandchildren.  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 End of Life Care- patient has not considered this: he does indicate he is not sure abour resuscitation, he does not want to be maintained in a less functional state or want heroic measures.(May '12)     Family History:  The patient's family history includes Heart attack (age of onset: 15) in his brother; Heart disease in his brother; Hyperlipidemia in his brother; Lung cancer in his other; Polycythemia (age of onset: 54) in his mother; Prostate cancer (age of onset: 62) in his father. There is no history of Diabetes or Colon cancer.   ROS:   Please see the history of present illness.    Review of Systems  Constitution: Positive for weight loss.  HENT: Positive for hearing loss.   Hematologic/Lymphatic: Bruises/bleeds easily.  All other systems reviewed and are negative.   Physical Exam:    VS:  BP 112/60 mmHg  Pulse 48  Ht  (1.854 m)  Wt 171 lb (77.565 kg)  BMI 22.57 kg/m2    GEN: Well nourished, well developed, in no acute distress HEENT: normal Neck: no JVD, no masses Cardiac: Normal S1/S2, RRR; no murmurs,  no edema;  no carotid bruits,   Respiratory:  clear to auscultation bilaterally; no wheezing, rhonchi or rales GI: soft, nontender  MS: no deformity or atrophy Skin: warm and dry  Neuro:   no focal deficits  Psych: Alert and oriented x 3, normal affect  Wt Readings from Last 3 Encounters:  02/13/16 171 lb (77.565 kg)  08/21/15 173 lb 6.4 oz (78.654 kg)  08/14/15 173 lb 8 oz (78.699 kg)      Studies/Labs Reviewed:     EKG:  EKG is  ordered today.  The ekg ordered today demonstrates Sinus brady, HR 48, normal axis, ant-septal Q waves, QTc 407 ms, no changes.   Recent Labs: 08/21/2015: ALT 21; BUN 20; Creatinine, Ser 1.26; Hemoglobin 13.5; Platelets 242.0; Potassium 4.2; Sodium 140   Recent Lipid Panel    Component Value Date/Time   CHOL 111 08/21/2015 1047   TRIG 119.0 08/21/2015 1047   HDL 47.50 08/21/2015 1047   CHOLHDL 2 08/21/2015 1047   VLDL 23.8 08/21/2015 1047   LDLCALC 40 08/21/2015 1047    Additional studies/ records that were reviewed today include:   Nuc Stress Test 11/11 Inf-lat infarct with very mild peri-infarct ischemia, EF 54%   ASSESSMENT:     1. Coronary artery disease involving native coronary artery of native heart without angina pectoris   2. Hyperlipidemia     PLAN:     In order of problems listed above:  1. CAD - Status post multiple remote PCI's. Nuclear stress test in 2011 with inferolateral scar with mild peri-infarct ischemia and normal ejection fraction.  No angina. Continue ASA, statin.   2. HL - Last LDL in 9/16 was 40. Continue statin.     Medication Adjustments/Labs and Tests Ordered: Current medicines are reviewed at length with the patient today.  Concerns regarding medicines are outlined above.  Medication changes, Labs and Tests ordered today are outlined in the Patient Instructions noted  below. Patient Instructions  Medication Instructions:  No changes. See your medication list.  If you need a refill on your cardiac medications before your next appointment, please call your pharmacy.  Labwork: None  Testing/Procedures: None   Follow-Up: Dr. Verne Carrow in 1 year.  Any Other Special Instructions Will Be Listed Below (If Applicable).   Signed, Tereso Newcomer, PA-C  02/13/2016  8:51 AM    [facility] [address] [place], [place], [place]  [address] Phone: [phone]; Fax: [phone]

## 2016-02-13 ENCOUNTER — Encounter: Payer: Self-pay | Admitting: Physician Assistant

## 2016-02-13 ENCOUNTER — Ambulatory Visit (INDEPENDENT_AMBULATORY_CARE_PROVIDER_SITE_OTHER): Payer: Medicare Other | Admitting: Physician Assistant

## 2016-02-13 VITALS — BP 112/60 | HR 48 | Ht 73.0 in | Wt 171.0 lb

## 2016-02-13 DIAGNOSIS — I2510 Atherosclerotic heart disease of native coronary artery without angina pectoris: Secondary | ICD-10-CM

## 2016-02-13 DIAGNOSIS — E785 Hyperlipidemia, unspecified: Secondary | ICD-10-CM

## 2016-02-13 NOTE — Patient Instructions (Addendum)
Medication Instructions:  No changes. See your medication list.  If you need a refill on your cardiac medications before your next appointment, please call your pharmacy.  Labwork: None  Testing/Procedures: None   Follow-Up: Dr. Verne Carrow in 1 year.  Any Other Special Instructions Will Be Listed Below (If Applicable).

## 2016-02-23 ENCOUNTER — Other Ambulatory Visit: Payer: Self-pay | Admitting: Cardiovascular Disease

## 2016-04-29 ENCOUNTER — Other Ambulatory Visit: Payer: Self-pay

## 2016-04-29 MED ORDER — ATORVASTATIN CALCIUM 10 MG PO TABS: 5.0000 mg | ORAL_TABLET | Freq: Every day | ORAL | Status: AC

## 2016-08-27 ENCOUNTER — Telehealth: Payer: Self-pay | Admitting: Internal Medicine

## 2016-08-27 NOTE — Telephone Encounter (Signed)
Pt stating he had flu [facility]. FYI

## 2016-09-09 ENCOUNTER — Ambulatory Visit (INDEPENDENT_AMBULATORY_CARE_PROVIDER_SITE_OTHER): Payer: Medicare Other | Admitting: Internal Medicine

## 2016-09-09 ENCOUNTER — Encounter: Payer: Self-pay | Admitting: Internal Medicine

## 2016-09-09 ENCOUNTER — Other Ambulatory Visit (INDEPENDENT_AMBULATORY_CARE_PROVIDER_SITE_OTHER): Payer: Medicare Other

## 2016-09-09 VITALS — BP 112/64 | HR 57 | Temp 97.8°F | Resp 12 | Ht 72.0 in | Wt 167.0 lb

## 2016-09-09 DIAGNOSIS — I2510 Atherosclerotic heart disease of native coronary artery without angina pectoris: Secondary | ICD-10-CM

## 2016-09-09 DIAGNOSIS — N400 Enlarged prostate without lower urinary tract symptoms: Secondary | ICD-10-CM | POA: Diagnosis not present

## 2016-09-09 DIAGNOSIS — E785 Hyperlipidemia, unspecified: Secondary | ICD-10-CM

## 2016-09-09 DIAGNOSIS — Z0000 Encounter for general adult medical examination without abnormal findings: Secondary | ICD-10-CM

## 2016-09-09 LAB — CBC
HCT: 39.8 % (ref 39.0–52.0)
Hemoglobin: 13.4 g/dL (ref 13.0–17.0)
MCHC: 33.7 g/dL (ref 30.0–36.0)
MCV: 89.1 fl (ref 78.0–100.0)
PLATELETS: 287 10*3/uL (ref 150.0–400.0)
RBC: 4.47 Mil/uL (ref 4.22–5.81)
RDW: 14.5 % (ref 11.5–15.5)
WBC: 10.1 10*3/uL (ref 4.0–10.5)

## 2016-09-09 LAB — COMPREHENSIVE METABOLIC PANEL
ALBUMIN: 3.9 g/dL (ref 3.5–5.2)
ALK PHOS: 71 U/L (ref 39–117)
ALT: 23 U/L (ref 0–53)
AST: 20 U/L (ref 0–37)
BILIRUBIN TOTAL: 0.6 mg/dL (ref 0.2–1.2)
BUN: 18 mg/dL (ref 6–23)
CALCIUM: 9.1 mg/dL (ref 8.4–10.5)
CHLORIDE: 104 meq/L (ref 96–112)
CO2: 29 mEq/L (ref 19–32)
CREATININE: 1.21 mg/dL (ref 0.40–1.50)
GFR: 60.67 mL/min (ref >60.00)
Glucose, Bld: 88 mg/dL (ref 70–99)
Potassium: 4.3 mEq/L (ref 3.5–5.1)
SODIUM: 139 meq/L (ref 135–145)
TOTAL PROTEIN: 6.8 g/dL (ref 6.0–8.3)

## 2016-09-09 LAB — LIPID PANEL
CHOLESTEROL: 101 mg/dL (ref 0–200)
HDL: 48 mg/dL (ref >39.00)
LDL Cholesterol: 38 mg/dL (ref 0–99)
NonHDL: 53.31
TRIGLYCERIDES: 77 mg/dL (ref 0.0–149.0)
Total CHOL/HDL Ratio: 2
VLDL: 15.4 mg/dL (ref 0.0–40.0)

## 2016-09-09 NOTE — Assessment & Plan Note (Signed)
Taking lipitor, checking lipid panel and adjust as needed.  

## 2016-09-09 NOTE — Assessment & Plan Note (Signed)
Taking aspirin daily and previous angioplasty in his 76s.

## 2016-09-09 NOTE — Progress Notes (Signed)
Pre visit review using our clinic review tool, if applicable. No additional management support is needed unless otherwise documented below in the visit note. 

## 2016-09-09 NOTE — Assessment & Plan Note (Signed)
Aged out of colonoscopy, tetanus due in 2024, flu shot done already this year. Pneumonia series completed and shingles done. Counseled about sun safety and mole surveillance. Given 10 year screening recommendations.

## 2016-09-09 NOTE — Assessment & Plan Note (Signed)
Stable on cardura. He is satisfied with symptoms.

## 2016-09-09 NOTE — Progress Notes (Signed)
   Subjective:    Patient ID: Wesley [name]., male    DOB: [birth year], 82 y.o.   MRN: [id number]  HPI Here for medicare wellness and CPE, no new complaints. Please see A/P for status and treatment of chronic medical problems.   Diet: heart healthy Physical activity: active Depression/mood screen: negative Hearing: intact to voice with bilateral aids Visual acuity: grossly normal, performs annual eye exam  ADLs: capable Fall risk: none Home safety: good Cognitive evaluation: intact to orientation, naming, recall and repetition EOL planning: adv directives discussed  I have personally reviewed and have noted 1. The patient's medical and social history - reviewed today no changes 2. Their use of alcohol, tobacco or illicit drugs 3. Their current medications and supplements 4. The patient's functional ability including ADL's, fall risks, home safety risks and hearing or visual impairment. 5. Diet and physical activities 6. Evidence for depression or mood disorders 7. Care team reviewed and updated (available in snapshot)  Review of Systems  Constitutional: Negative for activity change, appetite change, fever and unexpected weight change.  HENT: Negative for congestion and rhinorrhea.   Eyes: Negative for pain, discharge and itching.  Respiratory: Negative for cough, chest tightness, shortness of breath and wheezing.   Cardiovascular: Negative for chest pain, palpitations and leg swelling.  Gastrointestinal: Negative for abdominal distention, abdominal pain, constipation, diarrhea, nausea and vomiting.  Musculoskeletal: Negative for arthralgias and back pain.  Skin: Negative for color change and wound.  Neurological: Negative for dizziness, weakness, light-headedness and headaches.  Psychiatric/Behavioral: Negative.       Objective:   Physical Exam  Constitutional: He is oriented to person, place, and time. He appears well-developed and well-nourished. No distress.  HENT:  Head:  Normocephalic and atraumatic.  Eyes: EOM are normal.  Neck: Normal range of motion. Neck supple. No JVD present.  Cardiovascular: Normal rate and regular rhythm.   No murmur heard. Pulmonary/Chest: Effort normal and breath sounds normal. No respiratory distress. He has no wheezes. He has no rales.  Abdominal: Soft. Bowel sounds are normal. He exhibits no distension. There is no tenderness.  Musculoskeletal: Normal range of motion.  Neurological: He is alert and oriented to person, place, and time.  Skin: Skin is warm and dry. He is not diaphoretic.  Psychiatric: He has a normal mood and affect.   Vitals:   09/09/16 0954  BP: 112/64  Pulse: (!) 57  Resp: 12  Temp: 97.8 F (36.6 C)  TempSrc: Oral  SpO2: 97%  Weight: 167 lb (75.8 kg)  Height: 6' (1.829 m)      Assessment & Plan:

## 2016-09-09 NOTE — Patient Instructions (Signed)
We are checking the labs today and will call back with the results.   Health Maintenance, Male A healthy lifestyle and preventative care can promote health and wellness.  Maintain regular health, dental, and eye exams.  Eat a healthy diet. Foods like vegetables, fruits, whole grains, low-fat dairy products, and lean protein foods contain the nutrients you need and are low in calories. Decrease your intake of foods high in solid fats, added sugars, and salt. Get information about a proper diet from your health care provider, if necessary.  Regular physical exercise is one of the most important things you can do for your health. Most adults should get at least 150 minutes of moderate-intensity exercise (any activity that increases your heart rate and causes you to sweat) each week. In addition, most adults need muscle-strengthening exercises on 2 or more days a week.   Maintain a healthy weight. The body mass index (BMI) is a screening tool to identify possible weight problems. It provides an estimate of body fat based on height and weight. Your health care provider can find your BMI and can help you achieve or maintain a healthy weight. For males 20 years and older:  A BMI below 18.5 is considered underweight.  A BMI of 18.5 to 24.9 is normal.  A BMI of 25 to 29.9 is considered overweight.  A BMI of 30 and above is considered obese.  Maintain normal blood lipids and cholesterol by exercising and minimizing your intake of saturated fat. Eat a balanced diet with plenty of fruits and vegetables. Blood tests for lipids and cholesterol should begin at age 20 and be repeated every 5 years. If your lipid or cholesterol levels are high, you are over age 50, or you are at high risk for heart disease, you may need your cholesterol levels checked more frequently.Ongoing high lipid and cholesterol levels should be treated with medicines if diet and exercise are not working.  If you smoke, find out from  your health care provider how to quit. If you do not use tobacco, do not start.  Lung cancer screening is recommended for adults aged 55-80 years who are at high risk for developing lung cancer because of a history of smoking. A yearly low-dose CT scan of the lungs is recommended for people who have at least a 30-pack-year history of smoking and are current smokers or have quit within the past 15 years. A pack year of smoking is smoking an average of 1 pack of cigarettes a day for 1 year (for example, a 30-pack-year history of smoking could mean smoking 1 pack a day for 30 years or 2 packs a day for 15 years). Yearly screening should continue until the smoker has stopped smoking for at least 15 years. Yearly screening should be stopped for people who develop a health problem that would prevent them from having lung cancer treatment.  If you choose to drink alcohol, do not have more than 2 drinks per day. One drink is considered to be 12 oz (360 mL) of beer, 5 oz (150 mL) of wine, or 1.5 oz (45 mL) of liquor.  Avoid the use of street drugs. Do not share needles with anyone. Ask for help if you need support or instructions about stopping the use of drugs.  High blood pressure causes heart disease and increases the risk of stroke. High blood pressure is more likely to develop in:  People who have blood pressure in the end of the normal range (100-139/85-89 mm   Hg).  People who are overweight or obese.  People who are African American.  If you are 18-39 years of age, have your blood pressure checked every 3-5 years. If you are 40 years of age or older, have your blood pressure checked every year. You should have your blood pressure measured twice--once when you are at a hospital or clinic, and once when you are not at a hospital or clinic. Record the average of the two measurements. To check your blood pressure when you are not at a hospital or clinic, you can use:  An automated blood pressure machine  at a pharmacy.  A home blood pressure monitor.  If you are 45-79 years old, ask your health care provider if you should take aspirin to prevent heart disease.  Diabetes screening involves taking a blood sample to check your fasting blood sugar level. This should be done once every 3 years after age 45 if you are at a normal weight and without risk factors for diabetes. Testing should be considered at a younger age or be carried out more frequently if you are overweight and have at least 1 risk factor for diabetes.  Colorectal cancer can be detected and often prevented. Most routine colorectal cancer screening begins at the age of 50 and continues through age 75. However, your health care provider may recommend screening at an earlier age if you have risk factors for colon cancer. On a yearly basis, your health care provider may provide home test kits to check for hidden blood in the stool. A small camera at the end of a tube may be used to directly examine the colon (sigmoidoscopy or colonoscopy) to detect the earliest forms of colorectal cancer. Talk to your health care provider about this at age 50 when routine screening begins. A direct exam of the colon should be repeated every 5-10 years through age 75, unless early forms of precancerous polyps or small growths are found.  People who are at an increased risk for hepatitis B should be screened for this virus. You are considered at high risk for hepatitis B if:  You were born in a country where hepatitis B occurs often. Talk with your health care provider about which countries are considered high risk.  Your parents were born in a high-risk country and you have not received a shot to protect against hepatitis B (hepatitis B vaccine).  You have HIV or AIDS.  You use needles to inject street drugs.  You live with, or have sex with, someone who has hepatitis B.  You are a man who has sex with other men (MSM).  You get hemodialysis  treatment.  You take certain medicines for conditions like cancer, organ transplantation, and autoimmune conditions.  Hepatitis C blood testing is recommended for all people born from 1945 through 1965 and any individual with known risk factors for hepatitis C.  Healthy men should no longer receive prostate-specific antigen (PSA) blood tests as part of routine cancer screening. Talk to your health care provider about prostate cancer screening.  Testicular cancer screening is not recommended for adolescents or adult males who have no symptoms. Screening includes self-exam, a health care provider exam, and other screening tests. Consult with your health care provider about any symptoms you have or any concerns you have about testicular cancer.  Practice safe sex. Use condoms and avoid high-risk sexual practices to reduce the spread of sexually transmitted infections (STIs).  You should be screened for STIs, including gonorrhea and   chlamydia if:  You are sexually active and are younger than 24 years.  You are older than 24 years, and your health care provider tells you that you are at risk for this type of infection.  Your sexual activity has changed since you were last screened, and you are at an increased risk for chlamydia or gonorrhea. Ask your health care provider if you are at risk.  If you are at risk of being infected with HIV, it is recommended that you take a prescription medicine daily to prevent HIV infection. This is called pre-exposure prophylaxis (PrEP). You are considered at risk if:  You are a man who has sex with other men (MSM).  You are a heterosexual man who is sexually active with multiple partners.  You take drugs by injection.  You are sexually active with a partner who has HIV.  Talk with your health care provider about whether you are at high risk of being infected with HIV. If you choose to begin PrEP, you should first be tested for HIV. You should then be tested  every 3 months for as long as you are taking PrEP.  Use sunscreen. Apply sunscreen liberally and repeatedly throughout the day. You should seek shade when your shadow is shorter than you. Protect yourself by wearing long sleeves, pants, a wide-brimmed hat, and sunglasses year round whenever you are outdoors.  Tell your health care provider of new moles or changes in moles, especially if there is a change in shape or color. Also, tell your health care provider if a mole is larger than the size of a pencil eraser.  A one-time screening for abdominal aortic aneurysm (AAA) and surgical repair of large AAAs by ultrasound is recommended for men aged 65-75 years who are current or former smokers.  Stay current with your vaccines (immunizations).   This information is not intended to replace advice given to you by your health care provider. Make sure you discuss any questions you have with your health care provider.   Document Released: 05/27/2008 Document Revised: 12/20/2014 Document Reviewed: 04/26/2011 Elsevier Interactive Patient Education 2016 Elsevier Inc.  

## 2016-11-26 LAB — HEPATIC FUNCTION PANEL
ALK PHOS: 83 U/L (ref 25–125)
ALT: 31 U/L (ref 10–40)
AST: 23 U/L (ref 14–40)
Bilirubin, Total: 0.4 mg/dL

## 2016-11-26 LAB — BASIC METABOLIC PANEL
BUN: 19 mg/dL (ref 4–21)
CREATININE: 1.2 mg/dL (ref 0.6–1.3)
GLUCOSE: 85 mg/dL
Potassium: 4.5 mmol/L (ref 3.4–5.3)
Sodium: 141 mmol/L (ref 137–147)

## 2016-11-26 LAB — LIPID PANEL
CHOLESTEROL: 100 mg/dL (ref 0–200)
HDL: 66 mg/dL (ref 35–70)
LDL Cholesterol: 11 mg/dL
Triglycerides: 115 mg/dL (ref 40–160)

## 2016-11-26 LAB — CBC AND DIFFERENTIAL
HEMATOCRIT: 38 % — AB (ref 41–53)
HEMOGLOBIN: 12.8 g/dL — AB (ref 13.5–17.5)
Neutrophils Absolute: 6 /uL
PLATELETS: 259 10*3/uL (ref 150–399)
WBC: 9.7 10*3/mL

## 2016-11-26 LAB — HEMOGLOBIN A1C: Hemoglobin A1C: 5.9

## 2016-11-26 LAB — TSH: TSH: 4.98 u[IU]/mL (ref 0.41–5.90)

## 2016-12-07 ENCOUNTER — Encounter: Payer: Self-pay | Admitting: Internal Medicine

## 2016-12-07 NOTE — Progress Notes (Signed)
Results entered and sent to scan  

## 2017-02-10 ENCOUNTER — Encounter: Payer: Self-pay | Admitting: Cardiovascular Disease

## 2017-02-17 ENCOUNTER — Ambulatory Visit (INDEPENDENT_AMBULATORY_CARE_PROVIDER_SITE_OTHER): Payer: Medicare Other | Admitting: Cardiovascular Disease

## 2017-02-17 ENCOUNTER — Encounter: Payer: Self-pay | Admitting: Cardiovascular Disease

## 2017-02-17 VITALS — BP 140/70 | HR 52 | Ht 72.25 in | Wt 169.8 lb

## 2017-02-17 DIAGNOSIS — E7800 Pure hypercholesterolemia, unspecified: Secondary | ICD-10-CM

## 2017-02-17 DIAGNOSIS — I2510 Atherosclerotic heart disease of native coronary artery without angina pectoris: Secondary | ICD-10-CM

## 2017-02-17 DIAGNOSIS — R011 Cardiac murmur, unspecified: Secondary | ICD-10-CM

## 2017-02-17 NOTE — Progress Notes (Signed)
Chief Complaint  Patient presents with  . atherosclerosis of native coronary artery of naitve heart wi    1 year follow up   History of Present Illness: 83 yo Wesley [name] with history of CAD, HLD, BPH who is here today for cardiac follow up. He has CAD dating back to 1990 when he had angioplasty of his Circumflex followed by angioplasty of his RCA in 1995. Last cardiac cath in 1999 with stable disease. Last stress test in 2011 with no ischemia.   He is here today for follow up. He has no chest pain or SOB. He exercises 4 days per week. He works at the [employer]. He is a retired Paediatric nurse.   Primary Care Physician: [name], MD  Past Medical History:  Diagnosis Date  . BENIGN PROSTATIC HYPERTROPHY 01/07/2008  . Cataract   . CORONARY ARTERY DISEASE 01/07/2008  . FLANK PAIN, LEFT 01/05/2008  . Gross hematuria 05/23/2008  . HYPERLIPIDEMIA 01/07/2008  . Hypokinesia   . Ischemic heart disease     Past Surgical History:  Procedure Laterality Date  . CARDIAC CATHETERIZATION  1999  . HERNIA REPAIR  1988   Right  . Nephrolithiasis  1992   w/treatment    Current Outpatient Prescriptions  Medication Sig Dispense Refill  . aspirin 325 MG tablet Take 325 mg by mouth daily.      Marland Kitchen atorvastatin (LIPITOR) 10 MG tablet Take 0.5 tablets (5 mg total) by mouth daily. 45 tablet 3  . Calcium Carbonate (CALCIUM 600 PO) Take 1 tablet by mouth daily.    . calcium carbonate (OS-CAL) 600 MG TABS Take 600 mg by mouth daily.      . Cholecalciferol (D3-1000) 1000 units tablet Take 1,000 Units by mouth 2 (two) times daily.    Marland Kitchen doxazosin (CARDURA) 4 MG tablet Take 2 mg by mouth daily.    . fexofenadine (ALLEGRA) 180 MG tablet Take 180 mg by mouth daily.    . magnesium 30 MG tablet Take 30 mg by mouth daily.      . Multiple Vitamin (MULTIVITAMIN) tablet Take 1 tablet by mouth daily.    . Omega-3 Fatty Acids (FISH OIL) 600 MG CAPS Take by mouth.    . psyllium (METAMUCIL SMOOTH TEXTURE) 28 % packet  Take 1 packet by mouth 2 (two) times daily.      . vitamin B-12 (CYANOCOBALAMIN) 1000 MCG tablet Take 1,000 mcg by mouth daily.       No current facility-administered medications for this visit.     No Known Allergies  Social History   Social History  . Marital status: Widowed    Spouse name: N/A  . Number of children: N/A  . Years of education: N/A   Occupational History  . Not on file.   Social History Main Topics  . Smoking status: Former Smoker    Types: Cigarettes    Quit date: 12/13/1968  . Smokeless tobacco: Never Used     Comment: quit smoking before he turned 47  . Alcohol use 4.2 oz/week    7 Standard drinks or equivalent per week  . Drug use: No  . Sexual activity: No   Other Topics Concern  . Not on file   Social History Narrative   [employer].  [employer] 4 years. Married '63 - widowed '10. 1 son - '67. 3 grandchildren.  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 End of Life Care- patient has not considered this: he does indicate he is not sure abour  resuscitation, he does not want to be maintained in a less functional state or want heroic measures.(May '12)    Family History  Problem Relation Age of Onset  . Prostate cancer Father 32  . Heart disease Brother   . Hyperlipidemia Brother   . Lung cancer Other     1 sibling deceased  . Polycythemia Mother 72  . Heart attack Brother 50  . Diabetes Neg Hx   . Colon cancer Neg Hx     Review of Systems:  As stated in the HPI and otherwise negative.   BP 140/70   Pulse (!) 52   Ht 6' 0.25" (1.835 m)   Wt 169 lb 12.8 oz (77 kg)   BMI 22.87 kg/m   Physical Examination: General: Well developed, well nourished, NAD  HEENT: OP clear, mucus membranes moist  SKIN: warm, dry. No rashes. Neuro: No focal deficits  Musculoskeletal: Muscle strength 5/5 all ext  Psychiatric: Mood and affect normal  Neck: No JVD, no carotid bruits, no thyromegaly, no lymphadenopathy.  Lungs:Clear bilaterally, no wheezes, rhonci,  crackles Cardiovascular: Regular rate and rhythm. Soft systolic murmur. No gallops or rubs. Abdomen:Soft. Bowel sounds present. Non-tender.  Extremities: No lower extremity edema. Pulses are 2 + in the bilateral DP/PT.  EKG:  EKG is ordered today. The ekg ordered today demonstrates Sinus brady, rate 52 bpm.   Recent Labs: 11/26/2016: ALT 31; BUN 19; Creatinine 1.2; Hemoglobin 12.8; Platelets 259; Potassium 4.5; Sodium 141; TSH 4.98   Lipid Panel    Component Value Date/Time   CHOL 100 11/26/2016   TRIG 115 11/26/2016   HDL 66 11/26/2016   CHOLHDL 2 09/09/2016 1020   VLDL 15.4 09/09/2016 1020   LDLCALC 11 11/26/2016     Wt Readings from Last 3 Encounters:  02/17/17 169 lb 12.8 oz (77 kg)  09/09/16 167 lb (75.8 kg)  02/13/16 171 lb (77.6 kg)     Other studies Reviewed: Additional studies/ records that were reviewed today include: . Review of the above records demonstrates:   Assessment and Plan:   1. CAD: no chest pain suggestive of angina. Continue ASA and statin.   2. HLD: He is on a statin. LDL 11 in 2017.   3. Systolic murmur: Will arrange echo to assess.   Current medicines are reviewed at length with the patient today.  The patient does not have concerns regarding medicines.  The following changes have been made:  no change  Labs/ tests ordered today include:   Orders Placed This Encounter  Procedures  . EKG 12-Lead  . ECHOCARDIOGRAM COMPLETE    Disposition:   FU with me in 12 months  Signed, Verne Carrow, MD 02/17/2017 3:54 PM    [facility] [address] [place], [place], [place]  [address] Phone: [phone]; Fax: [phone]

## 2017-02-17 NOTE — Patient Instructions (Addendum)

## 2017-03-04 ENCOUNTER — Ambulatory Visit (HOSPITAL_COMMUNITY): Payer: Medicare Other | Attending: Cardiology

## 2017-03-04 ENCOUNTER — Other Ambulatory Visit: Payer: Self-pay

## 2017-03-04 DIAGNOSIS — I501 Left ventricular failure: Secondary | ICD-10-CM | POA: Insufficient documentation

## 2017-03-04 DIAGNOSIS — I420 Dilated cardiomyopathy: Secondary | ICD-10-CM | POA: Diagnosis not present

## 2017-03-04 DIAGNOSIS — I351 Nonrheumatic aortic (valve) insufficiency: Secondary | ICD-10-CM | POA: Insufficient documentation

## 2017-03-04 DIAGNOSIS — I371 Nonrheumatic pulmonary valve insufficiency: Secondary | ICD-10-CM | POA: Diagnosis not present

## 2017-03-04 DIAGNOSIS — R011 Cardiac murmur, unspecified: Secondary | ICD-10-CM

## 2017-12-22 ENCOUNTER — Other Ambulatory Visit (INDEPENDENT_AMBULATORY_CARE_PROVIDER_SITE_OTHER): Payer: Medicare Other

## 2017-12-22 ENCOUNTER — Ambulatory Visit (INDEPENDENT_AMBULATORY_CARE_PROVIDER_SITE_OTHER): Payer: Medicare Other | Admitting: Internal Medicine

## 2017-12-22 ENCOUNTER — Encounter: Payer: Self-pay | Admitting: Internal Medicine

## 2017-12-22 VITALS — BP 110/70 | HR 57 | Temp 97.8°F | Ht 72.25 in | Wt 174.0 lb

## 2017-12-22 DIAGNOSIS — E785 Hyperlipidemia, unspecified: Secondary | ICD-10-CM

## 2017-12-22 DIAGNOSIS — Z0000 Encounter for general adult medical examination without abnormal findings: Secondary | ICD-10-CM | POA: Diagnosis not present

## 2017-12-22 LAB — CBC
HCT: 43.1 % (ref 39.0–52.0)
HEMOGLOBIN: 14.4 g/dL (ref 13.0–17.0)
MCHC: 33.5 g/dL (ref 30.0–36.0)
MCV: 92 fl (ref 78.0–100.0)
Platelets: 245 10*3/uL (ref 150.0–400.0)
RBC: 4.69 Mil/uL (ref 4.22–5.81)
RDW: 13.6 % (ref 11.5–15.5)
WBC: 8.9 10*3/uL (ref 4.0–10.5)

## 2017-12-22 LAB — LIPID PANEL
CHOL/HDL RATIO: 2
Cholesterol: 114 mg/dL (ref 0–200)
HDL: 50.9 mg/dL (ref >39.00)
LDL CALC: 45 mg/dL (ref 0–99)
NONHDL: 62.95
Triglycerides: 89 mg/dL (ref 0.0–149.0)
VLDL: 17.8 mg/dL (ref 0.0–40.0)

## 2017-12-22 LAB — COMPREHENSIVE METABOLIC PANEL
ALT: 24 U/L (ref 0–53)
AST: 21 U/L (ref 0–37)
Albumin: 4.3 g/dL (ref 3.5–5.2)
Alkaline Phosphatase: 79 U/L (ref 39–117)
BUN: 21 mg/dL (ref 6–23)
CO2: 26 meq/L (ref 19–32)
Calcium: 9.3 mg/dL (ref 8.4–10.5)
Chloride: 105 mEq/L (ref 96–112)
Creatinine, Ser: 1.18 mg/dL (ref 0.40–1.50)
GFR: 62.26 mL/min (ref >60.00)
GLUCOSE: 93 mg/dL (ref 70–99)
POTASSIUM: 3.9 meq/L (ref 3.5–5.1)
SODIUM: 140 meq/L (ref 135–145)
Total Bilirubin: 0.7 mg/dL (ref 0.2–1.2)
Total Protein: 7.2 g/dL (ref 6.0–8.3)

## 2017-12-22 NOTE — Patient Instructions (Signed)
The new shingles vaccine is called shingrix and you can ask the pharmacist about that.   Health Maintenance, Male A healthy lifestyle and preventive care is important for your health and wellness. Ask your health care provider about what schedule of regular examinations is right for you. What should I know about weight and diet? Eat a Healthy Diet  Eat plenty of vegetables, fruits, whole grains, low-fat dairy products, and lean protein.  Do not eat a lot of foods high in solid fats, added sugars, or salt.  Maintain a Healthy Weight Regular exercise can help you achieve or maintain a healthy weight. You should:  Do at least 150 minutes of exercise each week. The exercise should increase your heart rate and make you sweat (moderate-intensity exercise).  Do strength-training exercises at least twice a week.  Watch Your Levels of Cholesterol and Blood Lipids  Have your blood tested for lipids and cholesterol every 5 years starting at 82 years of age. If you are at high risk for heart disease, you should start having your blood tested when you are 82 years old. You may need to have your cholesterol levels checked more often if: ? Your lipid or cholesterol levels are high. ? You are older than 82 years of age. ? You are at high risk for heart disease.  What should I know about cancer screening? Many types of cancers can be detected early and may often be prevented. Lung Cancer  You should be screened every year for lung cancer if: ? You are a current smoker who has smoked for at least 30 years. ? You are a former smoker who has quit within the past 15 years.  Talk to your health care provider about your screening options, when you should start screening, and how often you should be screened.  Colorectal Cancer  Routine colorectal cancer screening usually begins at 82 years of age and should be repeated every 5-10 years until you are 82 years old. You may need to be screened more often  if early forms of precancerous polyps or small growths are found. Your health care provider may recommend screening at an earlier age if you have risk factors for colon cancer.  Your health care provider may recommend using home test kits to check for hidden blood in the stool.  A small camera at the end of a tube can be used to examine your colon (sigmoidoscopy or colonoscopy). This checks for the earliest forms of colorectal cancer.  Prostate and Testicular Cancer  Depending on your age and overall health, your health care provider may do certain tests to screen for prostate and testicular cancer.  Talk to your health care provider about any symptoms or concerns you have about testicular or prostate cancer.  Skin Cancer  Check your skin from head to toe regularly.  Tell your health care provider about any new moles or changes in moles, especially if: ? There is a change in a mole's size, shape, or color. ? You have a mole that is larger than a pencil eraser.  Always use sunscreen. Apply sunscreen liberally and repeat throughout the day.  Protect yourself by wearing long sleeves, pants, a wide-brimmed hat, and sunglasses when outside.  What should I know about heart disease, diabetes, and high blood pressure?  If you are 42-57 years of age, have your blood pressure checked every 3-5 years. If you are 42 years of age or older, have your blood pressure checked every year. You should  have your blood pressure measured twice-once when you are at a hospital or clinic, and once when you are not at a hospital or clinic. Record the average of the two measurements. To check your blood pressure when you are not at a hospital or clinic, you can use: ? An automated blood pressure machine at a pharmacy. ? A home blood pressure monitor.  Talk to your health care provider about your target blood pressure.  If you are between 48-50 years old, ask your health care provider if you should take aspirin  to prevent heart disease.  Have regular diabetes screenings by checking your fasting blood sugar level. ? If you are at a normal weight and have a low risk for diabetes, have this test once every three years after the age of 85. ? If you are overweight and have a high risk for diabetes, consider being tested at a younger age or more often.  A one-time screening for abdominal aortic aneurysm (AAA) by ultrasound is recommended for men aged 65-75 years who are current or former smokers. What should I know about preventing infection? Hepatitis B If you have a higher risk for hepatitis B, you should be screened for this virus. Talk with your health care provider to find out if you are at risk for hepatitis B infection. Hepatitis C Blood testing is recommended for:  Everyone born from [birth year] through 1963.  Anyone with known risk factors for hepatitis C.  Sexually Transmitted Diseases (STDs)  You should be screened each year for STDs including gonorrhea and chlamydia if: ? You are sexually active and are younger than 82 years of age. ? You are older than 82 years of age and your health care provider tells you that you are at risk for this type of infection. ? Your sexual activity has changed since you were last screened and you are at an increased risk for chlamydia or gonorrhea. Ask your health care provider if you are at risk.  Talk with your health care provider about whether you are at high risk of being infected with HIV. Your health care provider may recommend a prescription medicine to help prevent HIV infection.  What else can I do?  Schedule regular health, dental, and eye exams.  Stay current with your vaccines (immunizations).  Do not use any tobacco products, such as cigarettes, chewing tobacco, and e-cigarettes. If you need help quitting, ask your health care provider.  Limit alcohol intake to no more than 2 drinks per day. One drink equals 12 ounces of beer, 5 ounces of wine,  or 1 ounces of hard liquor.  Do not use street drugs.  Do not share needles.  Ask your health care provider for help if you need support or information about quitting drugs.  Tell your health care provider if you often feel depressed.  Tell your health care provider if you have ever been abused or do not feel safe at home. This information is not intended to replace advice given to you by your health care provider. Make sure you discuss any questions you have with your health care provider. Document Released: 05/27/2008 Document Revised: 07/28/2016 Document Reviewed: 09/02/2015 Elsevier Interactive Patient Education  [employer].

## 2017-12-22 NOTE — Progress Notes (Signed)
   Subjective:    Patient ID: Wesley Song., male    DOB: 03-Sep-1930, 84 y.o.   MRN: [id number]  HPI Here for medicare wellness and physical, no new complaints. Please see A/P for status and treatment of chronic medical problems.   Diet: heart healthy Physical activity: sedentary Depression/mood screen: negative Hearing: moderate loss bilaterally Visual acuity: grossly normal, performs annual eye exam  ADLs: capable Fall risk: none Home safety: good Cognitive evaluation: intact to orientation, naming, recall and repetition EOL planning: adv directives discussed  I have personally reviewed and have noted 1. The patient's medical and social history - reviewed today no changes 2. Their use of alcohol, tobacco or illicit drugs 3. Their current medications and supplements 4. The patient's functional ability including ADL's, fall risks, home safety risks and hearing or visual impairment. 5. Diet and physical activities 6. Evidence for depression or mood disorders 7. Care team reviewed and updated (available in snapshot)  Review of Systems  Constitutional: Negative.   HENT: Negative.   Eyes: Negative.   Respiratory: Negative for cough, chest tightness and shortness of breath.   Cardiovascular: Negative for chest pain, palpitations and leg swelling.  Gastrointestinal: Negative for abdominal distention, abdominal pain, constipation, diarrhea, nausea and vomiting.  Musculoskeletal: Negative.   Skin: Negative.   Neurological: Negative.   Psychiatric/Behavioral: Negative.       Objective:   Physical Exam  Constitutional: He is oriented to person, place, and time. He appears well-developed and well-nourished.  HENT:  Head: Normocephalic and atraumatic.  Eyes: EOM are normal.  Neck: Normal range of motion.  Cardiovascular: Normal rate and regular rhythm.  Pulmonary/Chest: Effort normal and breath sounds normal. No respiratory distress. He has no wheezes. He has no rales.    Abdominal: Soft. Bowel sounds are normal. He exhibits no distension. There is no tenderness. There is no rebound.  Musculoskeletal: He exhibits no edema.  Neurological: He is alert and oriented to person, place, and time. Coordination normal.  Skin: Skin is warm and dry.  Psychiatric: He has a normal mood and affect.   Vitals:   12/22/17 0857  BP: 110/70  Pulse: (!) 57  Temp: 97.8 F (36.6 C)  TempSrc: Oral  SpO2: 98%  Weight: 174 lb (78.9 kg)  Height: 6' 0.25" (1.835 m)      Assessment & Plan:

## 2017-12-23 NOTE — Assessment & Plan Note (Signed)
Taking lipitor 10 mg daily and checking lipid panel and adjust as needed.  

## 2017-12-23 NOTE — Assessment & Plan Note (Signed)
Colonoscopy aged out although never done. Flu and tetanus and pneumonia up to date. Counseled about shingles vaccine and he will think about this. Counseled about sun safety and mole surveillance. Given 10 year screening recommendations.

## 2018-01-16 DIAGNOSIS — Z961 Presence of intraocular lens: Secondary | ICD-10-CM | POA: Diagnosis not present

## 2018-01-16 DIAGNOSIS — H52203 Unspecified astigmatism, bilateral: Secondary | ICD-10-CM | POA: Diagnosis not present

## 2018-01-16 DIAGNOSIS — H524 Presbyopia: Secondary | ICD-10-CM | POA: Diagnosis not present

## 2018-02-24 ENCOUNTER — Encounter: Payer: Self-pay | Admitting: Cardiovascular Disease

## 2018-03-05 NOTE — Progress Notes (Signed)
Chief Complaint  Patient presents with  . Follow-up    CAD   History of Present Illness: 84 yo male with history of CAD, HLD, BPH who is here today for cardiac follow up. He has CAD dating back to 1990 when he had angioplasty of his Circumflex followed by angioplasty of his RCA in 1995. Last cardiac cath in 1999 with stable disease. Last stress test in 2011 with no ischemia. Echo 03/01/17 with LVEF=50%, mild AI, mild MR.   He is here today for follow up. The patient denies any chest pain, dyspnea, palpitations, lower extremity edema, orthopnea, PND, dizziness, near syncope or syncope. He is a retired Paediatric nurse. He exercises 6 days per week.   Primary Care Physician: [name], MD  Past Medical History:  Diagnosis Date  . BENIGN PROSTATIC HYPERTROPHY 01/07/2008  . Cataract   . CORONARY ARTERY DISEASE 01/07/2008  . FLANK PAIN, LEFT 01/05/2008  . Gross hematuria 05/23/2008  . HYPERLIPIDEMIA 01/07/2008  . Hypokinesia   . Ischemic heart disease     Past Surgical History:  Procedure Laterality Date  . CARDIAC CATHETERIZATION  1999  . HERNIA REPAIR  1988   Right  . Nephrolithiasis  1992   w/treatment    Current Outpatient Medications  Medication Sig Dispense Refill  . atorvastatin (LIPITOR) 10 MG tablet Take 0.5 tablets (5 mg total) by mouth daily. 45 tablet 3  . Calcium Carbonate (CALCIUM 600 PO) Take 1 tablet by mouth daily.    . calcium carbonate (OS-CAL) 600 MG TABS Take 600 mg by mouth daily.      . Cholecalciferol (D3-1000) 1000 units tablet Take 1,000 Units by mouth 2 (two) times daily.    Marland Kitchen doxazosin (CARDURA) 4 MG tablet Take 2 mg by mouth daily.    . fexofenadine (ALLEGRA) 180 MG tablet Take 180 mg by mouth daily.    . magnesium 30 MG tablet Take 30 mg by mouth daily.      . Multiple Vitamin (MULTIVITAMIN) tablet Take 1 tablet by mouth daily.    . Omega-3 Fatty Acids (FISH OIL) 600 MG CAPS Take by mouth.    . psyllium (METAMUCIL SMOOTH TEXTURE) 28 % packet Take  1 packet by mouth 2 (two) times daily.      . vitamin B-12 (CYANOCOBALAMIN) 1000 MCG tablet Take 1,000 mcg by mouth daily.      Marland Kitchen aspirin EC 81 MG tablet Take 1 tablet (81 mg total) by mouth daily. 90 tablet 3   No current facility-administered medications for this visit.     No Known Allergies  Social History   Socioeconomic History  . Marital status: Widowed    Spouse name: Not on file  . Number of children: Not on file  . Years of education: Not on file  . Highest education level: Not on file  Occupational History  . Not on file  Social Needs  . Financial resource strain: Not on file  . Food insecurity:    Worry: Not on file    Inability: Not on file  . Transportation needs:    Medical: Not on file    Non-medical: Not on file  Tobacco Use  . Smoking status: Former Smoker    Types: Cigarettes    Last attempt to quit: 12/13/1968    Years since quitting: 49.2  . Smokeless tobacco: Never Used  . Tobacco comment: quit smoking before he turned 47  Substance and Sexual Activity  . Alcohol use: Yes  Alcohol/week: 4.2 oz    Types: 7 Standard drinks or equivalent per week  . Drug use: No  . Sexual activity: Never  Lifestyle  . Physical activity:    Days per week: Not on file    Minutes per session: Not on file  . Stress: Not on file  Relationships  . Social connections:    Talks on phone: Not on file    Gets together: Not on file    Attends religious service: Not on file    Active member of club or organization: Not on file    Attends meetings of clubs or organizations: Not on file    Relationship status: Not on file  . Intimate partner violence:    Fear of current or ex partner: Not on file    Emotionally abused: Not on file    Physically abused: Not on file    Forced sexual activity: Not on file  Other Topics Concern  . Not on file  Social History Narrative   [employer].  [employer] 4 years. Married '63 - widowed '10. 1 son - '67. 3 grandchildren.  Retired Emergency planning/management officer. Lives  alone. End of Life Care- patient has not considered this: he does indicate he is not sure abour resuscitation, he does not want to be maintained in a less functional state or want heroic measures.(May '12)    Family History  Problem Relation Age of Onset  . Prostate cancer Father 30  . Heart disease Brother   . Hyperlipidemia Brother   . Lung cancer Other        1 sibling deceased  . Polycythemia Mother 31  . Heart attack Brother 50  . Diabetes Neg Hx   . Colon cancer Neg Hx     Review of Systems:  As stated in the HPI and otherwise negative.   BP 122/80   Pulse (!) 52   Ht 6' 0.25" (1.835 m)   Wt 172 lb (78 kg)   SpO2 97%   BMI 23.17 kg/m   Physical Examination: General: Well developed, well nourished, NAD  HEENT: OP clear, mucus membranes moist  SKIN: warm, dry. No rashes. Neuro: No focal deficits  Musculoskeletal: Muscle strength 5/5 all ext  Psychiatric: Mood and affect normal  Neck: No JVD, no carotid bruits, no thyromegaly, no lymphadenopathy.  Lungs:Clear bilaterally, no wheezes, rhonci, crackles Cardiovascular: Regular rate and rhythm. Soft systolic murmur. No murmurs, gallops or rubs. Abdomen:Soft. Bowel sounds present. Non-tender.  Extremities: No lower extremity edema. Pulses are 2 + in the bilateral DP/PT.  Echo 03/01/17: - Left ventricle: The cavity size was mildly dilated. Systolic   function was mildly reduced. The estimated ejection fraction was   in the range of 45% to 50%. Images were inadequate for LV wall   motion assessment. Features are consistent with a pseudonormal   left ventricular filling pattern, with concomitant abnormal   relaxation and increased filling pressure (grade 2 diastolic   dysfunction). - Aortic valve: Mildly to moderately calcified annulus. Trileaflet;   normal thickness, mildly calcified leaflets. There was mild   regurgitation. Regurgitation pressure half-time: 675 ms. - Mitral valve: There was mild regurgitation directed  eccentrically   and posteriorly. - Left atrium: The atrium was mildly dilated. - Pulmonic valve: There was trivial regurgitation. - Pulmonary arteries: Systolic pressure could not be accurately   estimated.   EKG:  EKG is ordered today. The ekg ordered today demonstrates Sinus brady, low amplitude p waves. Unchanged.   Recent Labs:  12/22/2017: ALT 24; BUN 21; Creatinine, Ser 1.18; Hemoglobin 14.4; Platelets 245.0; Potassium 3.9; Sodium 140   Lipid Panel    Component Value Date/Time   CHOL 114 12/22/2017 0940   TRIG 89.0 12/22/2017 0940   HDL 50.90 12/22/2017 0940   CHOLHDL 2 12/22/2017 0940   VLDL 17.8 12/22/2017 0940   LDLCALC 45 12/22/2017 0940     Wt Readings from Last 3 Encounters:  03/06/18 172 lb (78 kg)  12/22/17 174 lb (78.9 kg)  02/17/17 169 lb 12.8 oz (77 kg)     Other studies Reviewed: Additional studies/ records that were reviewed today include: . Review of the above records demonstrates:   Assessment and Plan:   1. CAD without angina: No chest pain. Will continue statin and ASA.  No beta blocker with bradycardia.   2. HLD: Lipids controlled. Continue statin  3. Aortic insufficiency: Mild by echo 2018  4. Mitral regurgitation: Mild by echo in 2018  Current medicines are reviewed at length with the patient today.  The patient does not have concerns regarding medicines.  The following changes have been made:  no change  Labs/ tests ordered today include:   Orders Placed This Encounter  Procedures  . EKG 12-Lead    Disposition:   FU with me in 12 months  Signed, Verne Carrow, MD 03/06/2018 9:52 AM    [facility] [address] [place], [place], [place]  [address] Phone: [phone]; Fax: [phone]

## 2018-03-06 ENCOUNTER — Encounter: Payer: Self-pay | Admitting: Cardiovascular Disease

## 2018-03-06 ENCOUNTER — Ambulatory Visit: Payer: Medicare Other | Admitting: Cardiovascular Disease

## 2018-03-06 VITALS — BP 122/80 | HR 52 | Ht 72.25 in | Wt 172.0 lb

## 2018-03-06 DIAGNOSIS — E7800 Pure hypercholesterolemia, unspecified: Secondary | ICD-10-CM | POA: Diagnosis not present

## 2018-03-06 DIAGNOSIS — I2510 Atherosclerotic heart disease of native coronary artery without angina pectoris: Secondary | ICD-10-CM

## 2018-03-06 MED ORDER — ASPIRIN EC 81 MG PO TBEC: 81.0000 mg | DELAYED_RELEASE_TABLET | Freq: Every day | ORAL | 3 refills | Status: AC

## 2018-03-06 NOTE — Patient Instructions (Signed)
Medication Instructions:  Your physician has recommended you make the following change in your medication: Decrease aspirin to 81 mg by mouth daily.    Labwork: none  Testing/Procedures: none  Follow-Up: Your physician recommends that you schedule a follow-up appointment in: 12 months. Please call our office in about 9 months to schedule this appointment    Any Other Special Instructions Will Be Listed Below (If Applicable).     If you need a refill on your cardiac medications before your next appointment, please call your pharmacy.   

## 2018-09-07 ENCOUNTER — Telehealth: Payer: Self-pay | Admitting: Internal Medicine

## 2018-09-07 NOTE — Telephone Encounter (Signed)
Updated in patients chart

## 2018-09-07 NOTE — Telephone Encounter (Signed)
Pt wanted [place] to know he [place] his flu [place]. [facility].  Please document.

## 2018-12-25 ENCOUNTER — Ambulatory Visit (INDEPENDENT_AMBULATORY_CARE_PROVIDER_SITE_OTHER): Payer: Medicare Other | Admitting: *Deleted

## 2018-12-25 ENCOUNTER — Encounter: Payer: Self-pay | Admitting: Internal Medicine

## 2018-12-25 ENCOUNTER — Other Ambulatory Visit (INDEPENDENT_AMBULATORY_CARE_PROVIDER_SITE_OTHER): Payer: Medicare Other

## 2018-12-25 ENCOUNTER — Ambulatory Visit (INDEPENDENT_AMBULATORY_CARE_PROVIDER_SITE_OTHER): Payer: Medicare Other | Admitting: Internal Medicine

## 2018-12-25 VITALS — BP 122/70 | HR 56 | Ht 72.0 in | Wt 173.0 lb

## 2018-12-25 VITALS — BP 122/70 | HR 56 | Temp 97.6°F | Ht 72.25 in | Wt 173.0 lb

## 2018-12-25 DIAGNOSIS — Z0000 Encounter for general adult medical examination without abnormal findings: Secondary | ICD-10-CM

## 2018-12-25 DIAGNOSIS — I2510 Atherosclerotic heart disease of native coronary artery without angina pectoris: Secondary | ICD-10-CM | POA: Diagnosis not present

## 2018-12-25 DIAGNOSIS — H9193 Unspecified hearing loss, bilateral: Secondary | ICD-10-CM

## 2018-12-25 DIAGNOSIS — E7800 Pure hypercholesterolemia, unspecified: Secondary | ICD-10-CM

## 2018-12-25 LAB — COMPREHENSIVE METABOLIC PANEL
ALBUMIN: 4.1 g/dL (ref 3.5–5.2)
ALK PHOS: 63 U/L (ref 39–117)
ALT: 22 U/L (ref 0–53)
AST: 19 U/L (ref 0–37)
BILIRUBIN TOTAL: 0.7 mg/dL (ref 0.2–1.2)
BUN: 20 mg/dL (ref 6–23)
CALCIUM: 9.5 mg/dL (ref 8.4–10.5)
CO2: 28 mEq/L (ref 19–32)
Chloride: 103 mEq/L (ref 96–112)
Creatinine, Ser: 1.32 mg/dL (ref 0.40–1.50)
GFR: 54.57 mL/min — AB (ref >60.00)
GLUCOSE: 85 mg/dL (ref 70–99)
POTASSIUM: 4.2 meq/L (ref 3.5–5.1)
Sodium: 138 mEq/L (ref 135–145)
TOTAL PROTEIN: 6.7 g/dL (ref 6.0–8.3)

## 2018-12-25 LAB — CBC
HCT: 40.5 % (ref 39.0–52.0)
Hemoglobin: 13.6 g/dL (ref 13.0–17.0)
MCHC: 33.6 g/dL (ref 30.0–36.0)
MCV: 92.3 fl (ref 78.0–100.0)
Platelets: 243 10*3/uL (ref 150.0–400.0)
RBC: 4.39 Mil/uL (ref 4.22–5.81)
RDW: 13.6 % (ref 11.5–15.5)
WBC: 8.6 10*3/uL (ref 4.0–10.5)

## 2018-12-25 LAB — LIPID PANEL
CHOLESTEROL: 105 mg/dL (ref 0–200)
HDL: 54.3 mg/dL (ref >39.00)
LDL Cholesterol: 38 mg/dL (ref 0–99)
NONHDL: 51.09
Total CHOL/HDL Ratio: 2
Triglycerides: 65 mg/dL (ref 0.0–149.0)
VLDL: 13 mg/dL (ref 0.0–40.0)

## 2018-12-25 NOTE — Progress Notes (Signed)
Subjective:   Wesley [name]. is a 85 y.o. male who presents for Medicare Annual/Subsequent preventive examination.  Review of Systems:  No ROS.  Medicare Wellness Visit. Additional risk factors are reflected in the social history.  Cardiac Risk Factors include: advanced age (>38men, >47 women);male gender;dyslipidemia  Sleep patterns: feels rested on waking, gets up 3-4 times nightly to void and sleeps 7-8 hours nightly.    Home Safety/Smoke Alarms: Feels safe in home. Smoke alarms in place.  Living environment; residence and Firearm Safety: 1-story house/ trailer, no firearms. Lives alone, no needs for DME, good support system Seat Belt Safety/Bike Helmet: Wears seat belt.      Objective:    Vitals: BP 122/70   Pulse (!) 56   Ht 6' (1.829 m)   Wt 173 lb (78.5 kg)   SpO2 93%   BMI 23.46 kg/m   Body mass index is 23.46 kg/m.  Advanced Directives 12/25/2018 08/14/2015  Does Patient Have a Medical Advance Directive? No No  Would patient like information on creating a medical advance directive? Yes (ED - Information included in AVS) Yes - Educational materials given    Tobacco Social History   Tobacco Use  Smoking Status Former Smoker  . Types: Cigarettes  . Last attempt to quit: 12/13/1968  . Years since quitting: 50.0  Smokeless Tobacco Never Used  Tobacco Comment   quit smoking before he turned 47     Counseling given: Not Answered Comment: quit smoking before he turned 47  Past Medical History:  Diagnosis Date  . BENIGN PROSTATIC HYPERTROPHY 01/07/2008  . Cataract   . CORONARY ARTERY DISEASE 01/07/2008  . FLANK PAIN, LEFT 01/05/2008  . Gross hematuria 05/23/2008  . HYPERLIPIDEMIA 01/07/2008  . Hypokinesia   . Ischemic heart disease    Past Surgical History:  Procedure Laterality Date  . CARDIAC CATHETERIZATION  1999  . HERNIA REPAIR  1988   Right  . Nephrolithiasis  1992   w/treatment   Family History  Problem Relation Age of Onset  . Prostate cancer  Father 11  . Heart disease Brother   . Hyperlipidemia Brother   . Lung cancer Other        1 sibling deceased  . Polycythemia Mother 82  . Heart attack Brother 50  . Diabetes Neg Hx   . Colon cancer Neg Hx    Social History   Socioeconomic History  . Marital status: Widowed    Spouse name: Not on file  . Number of children: 1  . Years of education: Not on file  . Highest education level: Not on file  Occupational History  . Occupation: retired Doctor, hospital  . Financial resource strain: Not hard at all  . Food insecurity:    Worry: Never true    Inability: Never true  . Transportation needs:    Medical: No    Non-medical: No  Tobacco Use  . Smoking status: Former Smoker    Types: Cigarettes    Last attempt to quit: 12/13/1968    Years since quitting: 50.0  . Smokeless tobacco: Never Used  . Tobacco comment: quit smoking before he turned 47  Substance and Sexual Activity  . Alcohol use: Yes    Alcohol/week: 7.0 standard drinks    Types: 7 Standard drinks or equivalent per week    Comment: 1 glass of wine daily  . Drug use: No  . Sexual activity: Never  Lifestyle  . Physical activity:  Days per week: 7 days    Minutes per session: 80 min  . Stress: Only a little  Relationships  . Social connections:    Talks on phone: More than three times a week    Gets together: More than three times a week    Attends religious service: More than 4 times per year    Active member of club or organization: Yes    Attends meetings of clubs or organizations: More than 4 times per year    Relationship status: Widowed  Other Topics Concern  . Not on file  Social History Narrative   [employer].  [employer] 4 years. Married '63 - widowed '10. 1 son - '67. 3 grandchildren.  Retired Emergency planning/management officer- barber. Lives alone. End of Life Care- patient has not considered this: he does indicate he is not sure abour resuscitation, he does not want to be maintained in a less functional state or want heroic  measures.(May '12)    Outpatient Encounter Medications as of 12/25/2018  Medication Sig  . aspirin EC 81 MG tablet Take 1 tablet (81 mg total) by mouth daily.  Marland Kitchen. atorvastatin (LIPITOR) 10 MG tablet Take 0.5 tablets (5 mg total) by mouth daily.  . Calcium Carbonate (CALCIUM 600 PO) Take 1 tablet by mouth daily.  . Cholecalciferol (D3-1000) 1000 units tablet Take 1,000 Units by mouth 2 (two) times daily.  Marland Kitchen. doxazosin (CARDURA) 4 MG tablet Take 2 mg by mouth daily.  . fexofenadine (ALLEGRA) 180 MG tablet Take 180 mg by mouth daily.  . magnesium 30 MG tablet Take 30 mg by mouth daily.    . Multiple Vitamin (MULTIVITAMIN) tablet Take 1 tablet by mouth daily.  . Omega-3 Fatty Acids (FISH OIL) 600 MG CAPS Take by mouth.  . psyllium (METAMUCIL SMOOTH TEXTURE) 28 % packet Take 1 packet by mouth 2 (two) times daily.    . vitamin B-12 (CYANOCOBALAMIN) 1000 MCG tablet Take 1,000 mcg by mouth daily.     No facility-administered encounter medications on file as of 12/25/2018.     Activities of Daily Living In your present state of health, do you have any difficulty performing the following activities: 12/25/2018  Hearing? Y  Vision? N  Difficulty concentrating or making decisions? N  Walking or climbing stairs? N  Dressing or bathing? N  Doing errands, shopping? N  Preparing Food and eating ? N  Using the Toilet? N  In the past six months, have you accidently leaked urine? N  Do you have problems with loss of bowel control? N  Managing your Medications? N  Managing your Finances? Y  Housekeeping or managing your Housekeeping? N  Some recent data might be hidden    Patient Care Team: Myrlene Brokerrawford, Elizabeth A, MD as PCP - General (Internal Medicine) Kathleene HazelMcAlhany, Christopher D, MD as PCP - Cardiology (Cardiology) Ihor Gullyttelin, Mark, MD (Urology) Kathleene HazelMcAlhany, Christopher D, MD as Consulting Physician (Cardiology)   Assessment:   This is a routine wellness examination for Wesley [name]. Physical assessment deferred  to PCP.  Exercise Activities and Dietary recommendations Current Exercise Habits: Structured exercise class, Type of exercise: walking;strength training/weights;calisthenics;treadmill, Time (Minutes): 60, Frequency (Times/Week): 7, Weekly Exercise (Minutes/Week): 420, Intensity: Mild, Exercise limited by: None identified  Diet (meal preparation, eat out, water intake, caffeinated beverages, dairy products, fruits and vegetables): in general, a "healthy" diet  , well balanced. Reports good appetite. eats a variety of fruits and vegetables daily, limits salt, fat/cholesterol, sugar,carbohydrates,caffeine, drinks 6-8 glasses of water daily.  Goals    .  Exercise 150 minutes per week (moderate activity)     Will continue to go to the gym; stay healthy    . Patient Stated     Stay active physically and socially, eat healthy and stay as independent as possible.        Fall Risk Fall Risk  12/25/2018 12/25/2018 12/22/2017 09/09/2016 08/21/2015  Falls in the past year? 0 0 No No No   Depression Screen PHQ 2/9 Scores 12/25/2018 12/25/2018 12/22/2017 09/09/2016  PHQ - 2 Score 0 0 0 0    Cognitive Function MMSE - Mini Mental State Exam 12/25/2018 08/14/2015  Orientation to time 5 5  Orientation to Place 5 5  Registration 3 3  Attention/ Calculation 4 3  Recall 0 0  Language- name 2 objects 2 2  Language- repeat 1 0  Language- follow 3 step command 3 2  Language- read & follow direction 1 1  Write a sentence 1 1  Copy design 1 1  Total score 26 23        Immunization History  Administered Date(s) Administered  . Influenza, High Dose Seasonal PF 08/14/2015, 08/22/2016  . Influenza,inj,Quad PF,6+ Mos 09/11/2013  . Influenza-Unspecified 09/09/2017, 08/30/2018  . Pneumococcal Conjugate-13 08/27/2013  . Pneumococcal Polysaccharide-23 08/14/2015  . Tetanus 08/27/2013  . Zoster 09/11/2013   Screening Tests Health Maintenance  Topic Date Due  . TETANUS/TDAP  08/28/2023  . INFLUENZA VACCINE   Completed  . PNA vac Low Risk Adult  Completed       Plan:      Health maintenance screenings were reviewed with patient today and relevant education provided.   Continue doing brain stimulating activities (puzzles, reading, adult coloring books, staying active) to keep memory sharp.   Continue to eat heart healthy diet (full of fruits, vegetables, whole grains, lean protein, water--limit salt, fat, and sugar intake) and increase physical activity as tolerated.  I have personally reviewed and noted the following in the patient's chart:   . Medical and social history . Use of alcohol, tobacco or illicit drugs  . Current medications and supplements . Functional ability and status . Nutritional status . Physical activity . Advanced directives . List of other physicians . Vitals . Screenings to include cognitive, depression, and falls . Referrals and appointments  In addition, I have reviewed and discussed with patient certain preventive protocols, quality metrics, and best practice recommendations. A written personalized care plan for preventive services as well as general preventive health recommendations were provided to patient.     Wanda PlumpJill A Wine, RN  12/25/2018

## 2018-12-25 NOTE — Progress Notes (Signed)
   Subjective:   Patient ID: Wesley Song., male    DOB: 11/01/1930, 85 y.o.   MRN: [id number]  HPI The patient is an 85 YO man coming in for physical. No new concerns.   PMH, [facility], social history reviewed and updated.   Review of Systems  Constitutional: Negative.   HENT: Negative.   Eyes: Negative.   Respiratory: Negative for cough, chest tightness and shortness of breath.   Cardiovascular: Negative for chest pain, palpitations and leg swelling.  Gastrointestinal: Negative for abdominal distention, abdominal pain, constipation, diarrhea, nausea and vomiting.  Musculoskeletal: Negative.   Skin: Negative.   Neurological: Negative.   Psychiatric/Behavioral: Negative.     Objective:  Physical Exam Constitutional:      Appearance: He is well-developed.  HENT:     Head: Normocephalic and atraumatic.  Neck:     Musculoskeletal: Normal range of motion.  Cardiovascular:     Rate and Rhythm: Normal rate and regular rhythm.  Pulmonary:     Effort: Pulmonary effort is normal. No respiratory distress.     Breath sounds: Normal breath sounds. No wheezing or rales.  Abdominal:     General: Bowel sounds are normal. There is no distension.     Palpations: Abdomen is soft.     Tenderness: There is no abdominal tenderness. There is no rebound.  Skin:    General: Skin is warm and dry.  Neurological:     Mental Status: He is alert and oriented to person, place, and time.     Coordination: Coordination normal.     Vitals:   12/25/18 0849  BP: 122/70  Pulse: (!) 56  Temp: 97.6 F (36.4 C)  TempSrc: Oral  SpO2: 93%  Weight: 173 lb (78.5 kg)  Height: 6' 0.25" (1.835 m)    Assessment & Plan:

## 2018-12-25 NOTE — Assessment & Plan Note (Signed)
Checking lipid panel and adjust lipitor 5 mg daily.

## 2018-12-25 NOTE — Assessment & Plan Note (Signed)
Stable with bilateral hearing aids.

## 2018-12-25 NOTE — Patient Instructions (Addendum)
Continue doing brain stimulating activities (puzzles, reading, adult coloring books, staying active) to keep memory sharp.   Continue to eat heart healthy diet (full of fruits, vegetables, whole grains, lean protein, water--limit salt, fat, and sugar intake) and increase physical activity as tolerated.   Wesley [name] , Thank you for taking time to come for your Medicare Wellness Visit. I appreciate your ongoing commitment to your health goals. Please review the following plan we discussed and let me know if I can assist you in the future.   These are the goals we discussed: Goals    . Exercise 150 minutes per week (moderate activity)     Will continue to go to the gym; stay healthy    . Patient Stated     Stay active physically and socially, eat healthy and stay as independent as possible.        This is a list of the screening recommended for you and due dates:  Health Maintenance  Topic Date Due  . Tetanus Vaccine  08/28/2023  . Flu Shot  Completed  . Pneumonia vaccines  Completed    Health Maintenance, Male A healthy lifestyle and preventive care is important for your health and wellness. Ask your health care provider about what schedule of regular examinations is right for you. What should I know about weight and diet? Eat a Healthy Diet  Eat plenty of vegetables, fruits, whole grains, low-fat dairy products, and lean protein.  Do not eat a lot of foods high in solid fats, added sugars, or salt.  Maintain a Healthy Weight Regular exercise can help you achieve or maintain a healthy weight. You should:  Do at least 150 minutes of exercise each week. The exercise should increase your heart rate and make you sweat (moderate-intensity exercise).  Do strength-training exercises at least twice a week. Watch Your Levels of Cholesterol and Blood Lipids  Have your blood tested for lipids and cholesterol every 5 years starting at 83 years of age. If you are at high risk for heart  disease, you should start having your blood tested when you are 83 years old. You may need to have your cholesterol levels checked more often if: ? Your lipid or cholesterol levels are high. ? You are older than 83 years of age. ? You are at high risk for heart disease. What should I know about cancer screening? Many types of cancers can be detected early and may often be prevented. Lung Cancer  You should be screened every year for lung cancer if: ? You are a current smoker who has smoked for at least 30 years. ? You are a former smoker who has quit within the past 15 years.  Talk to your health care provider about your screening options, when you should start screening, and how often you should be screened. Colorectal Cancer  Routine colorectal cancer screening usually begins at 83 years of age and should be repeated every 5-10 years until you are 83 years old. You may need to be screened more often if early forms of precancerous polyps or small growths are found. Your health care provider may recommend screening at an earlier age if you have risk factors for colon cancer.  Your health care provider may recommend using home test kits to check for hidden blood in the stool.  A small camera at the end of a tube can be used to examine your colon (sigmoidoscopy or colonoscopy). This checks for the earliest forms of colorectal cancer.  Prostate and Testicular Cancer  Depending on your age and overall health, your health care provider may do certain tests to screen for prostate and testicular cancer.  Talk to your health care provider about any symptoms or concerns you have about testicular or prostate cancer. Skin Cancer  Check your skin from head to toe regularly.  Tell your health care provider about any new moles or changes in moles, especially if: ? There is a change in a mole's size, shape, or color. ? You have a mole that is larger than a pencil eraser.  Always use sunscreen. Apply  sunscreen liberally and repeat throughout the day.  Protect yourself by wearing long sleeves, pants, a wide-brimmed hat, and sunglasses when outside. What should I know about heart disease, diabetes, and high blood pressure?  If you are 34-54 years of age, have your blood pressure checked every 3-5 years. If you are 53 years of age or older, have your blood pressure checked every year. You should have your blood pressure measured twice-once when you are at a hospital or clinic, and once when you are not at a hospital or clinic. Record the average of the two measurements. To check your blood pressure when you are not at a hospital or clinic, you can use: ? An automated blood pressure machine at a pharmacy. ? A home blood pressure monitor.  Talk to your health care provider about your target blood pressure.  If you are between 50-33 years old, ask your health care provider if you should take aspirin to prevent heart disease.  Have regular diabetes screenings by checking your fasting blood sugar level. ? If you are at a normal weight and have a low risk for diabetes, have this test once every three years after the age of 18. ? If you are overweight and have a high risk for diabetes, consider being tested at a younger age or more often.  A one-time screening for abdominal aortic aneurysm (AAA) by ultrasound is recommended for men aged 65-75 years who are current or former smokers. What should I know about preventing infection? Hepatitis B If you have a higher risk for hepatitis B, you should be screened for this virus. Talk with your health care provider to find out if you are at risk for hepatitis B infection. Hepatitis C Blood testing is recommended for:  Everyone born from [birth year] through 1963.  Anyone with known risk factors for hepatitis C. Sexually Transmitted Diseases (STDs)  You should be screened each year for STDs including gonorrhea and chlamydia if: ? You are sexually active and  are younger than 83 years of age. ? You are older than 83 years of age and your health care provider tells you that you are at risk for this type of infection. ? Your sexual activity has changed since you were last screened and you are at an increased risk for chlamydia or gonorrhea. Ask your health care provider if you are at risk.  Talk with your health care provider about whether you are at high risk of being infected with HIV. Your health care provider may recommend a prescription medicine to help prevent HIV infection. What else can I do?  Schedule regular health, dental, and eye exams.  Stay current with your vaccines (immunizations).  Do not use any tobacco products, such as cigarettes, chewing tobacco, and e-cigarettes. If you need help quitting, ask your health care provider.  Limit alcohol intake to no more than 2 drinks per day. One  drink equals 12 ounces of beer, 5 ounces of , or 1 ounces of hard liquor.  Do not use street drugs.  Do not share needles.  Ask your health care provider for help if you need support or information about quitting drugs.  Tell your health care provider if you often feel depressed.  Tell your health care provider if you have ever been abused or do not feel safe at home. This information is not intended to replace advice given to you by your health care provider. Make sure you discuss any questions you have with your health care provider. Document Released: 05/27/2008 Document Revised: 07/28/2016 Document Reviewed: 09/02/2015 Elsevier Interactive Patient Education  2019 [employer].

## 2018-12-25 NOTE — Patient Instructions (Signed)
Health Maintenance After Age 83 After age 83, you are at a higher risk for certain long-term diseases and infections as well as injuries from falls. Falls are a major cause of broken bones and head injuries in people who are older than age 83. Getting regular preventive care can help to keep you healthy and well. Preventive care includes getting regular testing and making lifestyle changes as recommended by your health care provider. Talk with your health care provider about:  Which screenings and tests you should have. A screening is a test that checks for a disease when you have no symptoms.  A diet and exercise plan that is right for you. What should I know about screenings and tests to prevent falls? Screening and testing are the best ways to find a health problem early. Early diagnosis and treatment give you the best chance of managing medical conditions that are common after age 83. Certain conditions and lifestyle choices may make you more likely to have a fall. Your health care provider may recommend:  Regular vision checks. Poor vision and conditions such as cataracts can make you more likely to have a fall. If you wear glasses, make sure to get your prescription updated if your vision changes.  Medicine review. Work with your health care provider to regularly review all of the medicines you are taking, including over-the-counter medicines. Ask your health care provider about any side effects that may make you more likely to have a fall. Tell your health care provider if any medicines that you take make you feel dizzy or sleepy.  Osteoporosis screening. Osteoporosis is a condition that causes the bones to get weaker. This can make the bones weak and cause them to break more easily.  Blood pressure screening. Blood pressure changes and medicines to control blood pressure can make you feel dizzy.  Strength and balance checks. Your health care provider may recommend certain tests to check your  strength and balance while standing, walking, or changing positions.  Foot health exam. Foot pain and numbness, as well as not wearing proper footwear, can make you more likely to have a fall.  Depression screening. You may be more likely to have a fall if you have a fear of falling, feel emotionally low, or feel unable to do activities that you used to do.  Alcohol use screening. Using too much alcohol can affect your balance and may make you more likely to have a fall. What actions can I take to lower my risk of falls? General instructions  Talk with your health care provider about your risks for falling. Tell your health care provider if: ? You fall. Be sure to tell your health care provider about all falls, even ones that seem minor. ? You feel dizzy, sleepy, or off-balance.  Take over-the-counter and prescription medicines only as told by your health care provider. These include any supplements.  Eat a healthy diet and maintain a healthy weight. A healthy diet includes low-fat dairy products, low-fat (lean) meats, and fiber from whole grains, beans, and lots of fruits and vegetables. Home safety  Remove any tripping hazards, such as rugs, cords, and clutter.  Install safety equipment such as grab bars in bathrooms and safety rails on stairs.  Keep rooms and walkways well-lit. Activity   Follow a regular exercise program to stay fit. This will help you maintain your balance. Ask your health care provider what types of exercise are appropriate for you.  If you need a cane or   walker, use it as recommended by your health care provider.  Wear supportive shoes that have nonskid soles. Lifestyle  Do not drink alcohol if your health care provider tells you not to drink.  If you drink alcohol, limit how much you have: ? 0-1 drink a day for women. ? 0-2 drinks a day for men.  Be aware of how much alcohol is in your drink. In the [place] one drink equals one typical bottle of beer (12  oz), one-half glass of wine (5 oz), or one shot of hard liquor (1 oz).  Do not use any products that contain nicotine or tobacco, such as cigarettes and e-cigarettes. If you need help quitting, ask your health care provider. Summary  Having a healthy lifestyle and getting preventive care can help to protect your health and wellness after age 83.  Screening and testing are the best way to find a health problem early and help you avoid having a fall. Early diagnosis and treatment give you the best chance for managing medical conditions that are more common for people who are older than age 83.  Falls are a major cause of broken bones and head injuries in people who are older than age 83. Take precautions to prevent a fall at home.  Work with your health care provider to learn what changes you can make to improve your health and wellness and to prevent falls. This information is not intended to replace advice given to you by your health care provider. Make sure you discuss any questions you have with your health care provider. Document Released: 10/12/2017 Document Revised: 10/12/2017 Document Reviewed: 10/12/2017 Elsevier Interactive Patient Education  2019 [employer] Inc[employer]  

## 2018-12-25 NOTE — Progress Notes (Signed)
Medical screening examination/treatment/procedure(s) were performed by non-physician practitioner and as supervising physician I was immediately available for consultation/collaboration. I agree with above. Elizabeth A Crawford, MD 

## 2018-12-25 NOTE — Assessment & Plan Note (Signed)
Taking aspirin 81 mg daily and no chest pains currently. Taking atorvastatin low dose as well.

## 2018-12-25 NOTE — Assessment & Plan Note (Signed)
Flu shot up to date. Pneumonia complete. Shingrix counseled. Tetanus up to date. Colonoscopy never had. Counseled about sun safety and mole surveillance. Counseled about the dangers of distracted driving. Given 10 year screening recommendations.

## 2019-01-26 DIAGNOSIS — Z961 Presence of intraocular lens: Secondary | ICD-10-CM | POA: Diagnosis not present

## 2019-02-23 ENCOUNTER — Encounter: Payer: Self-pay | Admitting: Cardiovascular Disease

## 2019-02-23 ENCOUNTER — Other Ambulatory Visit: Payer: Self-pay

## 2019-02-23 ENCOUNTER — Ambulatory Visit: Payer: Medicare Other | Admitting: Cardiovascular Disease

## 2019-02-23 VITALS — BP 110/62 | HR 51 | Ht 72.0 in | Wt 176.2 lb

## 2019-02-23 DIAGNOSIS — I2510 Atherosclerotic heart disease of native coronary artery without angina pectoris: Secondary | ICD-10-CM | POA: Diagnosis not present

## 2019-02-23 DIAGNOSIS — I351 Nonrheumatic aortic (valve) insufficiency: Secondary | ICD-10-CM

## 2019-02-23 DIAGNOSIS — E7800 Pure hypercholesterolemia, unspecified: Secondary | ICD-10-CM

## 2019-02-23 NOTE — Patient Instructions (Signed)
Medication Instructions:  Your physician recommends that you continue on your current medications as directed. Please refer to the Current Medication list given to you today.  If you need a refill on your cardiac medications before your next appointment, please call your pharmacy.   Lab work: none If you have labs (blood work) drawn today and your tests are completely normal, you will receive your results only by: . MyChart Message (if you have MyChart) OR . A paper copy in the mail If you have any lab test that is abnormal or we need to change your treatment, we will call you to review the results.  Testing/Procedures: none  Follow-Up: At [facility], you and your health needs are our priority.  As part of our continuing mission to provide you with exceptional heart care, we have created designated Provider Care Teams.  These Care Teams include your primary Cardiologist (physician) and Advanced Practice Providers (APPs -  Physician Assistants and Nurse Practitioners) who all work together to provide you with the care you need, when you need it. You will need a follow up appointment in 10 -14 months.  Please call our office 2 months in advance to schedule this appointment.  You may see Christopher McAlhany, MD or one of the following Advanced Practice Providers on your designated Care Team:   Brittainy Simmons, PA-C Dayna Dunn, PA-C . Michele Lenze, PA-C  Any Other Special Instructions Will Be Listed Below (If Applicable).    

## 2019-02-23 NOTE — Progress Notes (Signed)
Chief Complaint  Patient presents with  . Follow-up    CAD   History of Present Illness: 85 yo male with history of CAD, HLD and BPH who is here today for cardiac follow up. He has CAD dating back to 1990 when he had angioplasty of his Circumflex followed by angioplasty of his RCA in 1995. Last cardiac cath in 1999 with stable disease. Last stress test in 2011 with no ischemia. Echo 03/01/17 with LVEF=50%, mild AI, mild MR. He is a retired Paediatric nurse.   He is here today for follow up. The patient denies any chest pain, dyspnea, palpitations, lower extremity edema, orthopnea, PND, dizziness, near syncope or syncope.    Primary Care Physician: [name], MD  Past Medical History:  Diagnosis Date  . BENIGN PROSTATIC HYPERTROPHY 01/07/2008  . Cataract   . CORONARY ARTERY DISEASE 01/07/2008  . FLANK PAIN, LEFT 01/05/2008  . Gross hematuria 05/23/2008  . HYPERLIPIDEMIA 01/07/2008  . Hypokinesia   . Ischemic heart disease     Past Surgical History:  Procedure Laterality Date  . CARDIAC CATHETERIZATION  1999  . HERNIA REPAIR  1988   Right  . Nephrolithiasis  1992   w/treatment    Current Outpatient Medications  Medication Sig Dispense Refill  . aspirin EC 81 MG tablet Take 1 tablet (81 mg total) by mouth daily. 90 tablet 3  . atorvastatin (LIPITOR) 10 MG tablet Take 0.5 tablets (5 mg total) by mouth daily. 45 tablet 3  . Calcium Carbonate (CALCIUM 600 PO) Take 1 tablet by mouth daily.    . Cholecalciferol (D3-1000) 1000 units tablet Take 1,000 Units by mouth 2 (two) times daily.    Marland Kitchen doxazosin (CARDURA) 4 MG tablet Take 2 mg by mouth daily.    . fexofenadine (ALLEGRA) 180 MG tablet Take 180 mg by mouth daily.    . magnesium 30 MG tablet Take 30 mg by mouth daily.      . Omega-3 Fatty Acids (FISH OIL) 600 MG CAPS Take by mouth.    . psyllium (METAMUCIL SMOOTH TEXTURE) 28 % packet Take 1 packet by mouth 2 (two) times daily.      . vitamin B-12 (CYANOCOBALAMIN) 1000 MCG tablet  Take 1,000 mcg by mouth daily.       No current facility-administered medications for this visit.     No Known Allergies  Social History   Socioeconomic History  . Marital status: Widowed    Spouse name: Not on file  . Number of children: 1  . Years of education: Not on file  . Highest education level: Not on file  Occupational History  . Occupation: retired Doctor, hospital  . Financial resource strain: Not hard at all  . Food insecurity:    Worry: Never true    Inability: Never true  . Transportation needs:    Medical: No    Non-medical: No  Tobacco Use  . Smoking status: Former Smoker    Types: Cigarettes    Last attempt to quit: 12/13/1968    Years since quitting: 50.2  . Smokeless tobacco: Never Used  . Tobacco comment: quit smoking before he turned 47  Substance and Sexual Activity  . Alcohol use: Yes    Alcohol/week: 7.0 standard drinks    Types: 7 Standard drinks or equivalent per week    Comment: 1 glass of wine daily  . Drug use: No  . Sexual activity: Never  Lifestyle  . Physical activity:  Days per week: 7 days    Minutes per session: 80 min  . Stress: Only a little  Relationships  . Social connections:    Talks on phone: More than three times a week    Gets together: More than three times a week    Attends religious service: More than 4 times per year    Active member of club or organization: Yes    Attends meetings of clubs or organizations: More than 4 times per year    Relationship status: Widowed  . Intimate partner violence:    Fear of current or ex partner: Not on file    Emotionally abused: Not on file    Physically abused: Not on file    Forced sexual activity: Not on file  Other Topics Concern  . Not on file  Social History Narrative   [employer].  [employer] 4 years. Married '63 - widowed '10. 1 son - '67. 3 grandchildren.  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 End of Life Care- patient has not considered this: he does indicate he is not sure abour  resuscitation, he does not want to be maintained in a less functional state or want heroic measures.(May '12)    Family History  Problem Relation Age of Onset  . Prostate cancer Father 88  . Heart disease Brother   . Hyperlipidemia Brother   . Lung cancer Other        1 sibling deceased  . Polycythemia Mother 70  . Heart attack Brother 50  . Diabetes Neg Hx   . Colon cancer Neg Hx     Review of Systems:  As stated in the HPI and otherwise negative.   BP 110/62   Pulse (!) 51   Ht 6' (1.829 m)   Wt 176 lb 3.2 oz (79.9 kg)   SpO2 97%   BMI 23.90 kg/m   Physical Examination: General: Well developed, well nourished, NAD  HEENT: OP clear, mucus membranes moist  SKIN: warm, dry. No rashes. Neuro: No focal deficits  Musculoskeletal: Muscle strength 5/5 all ext  Psychiatric: Mood and affect normal  Neck: No JVD, no carotid bruits, no thyromegaly, no lymphadenopathy.  Lungs:Clear bilaterally, no wheezes, rhonci, crackles Cardiovascular: Regular rate and rhythm. No murmurs, gallops or rubs. Abdomen:Soft. Bowel sounds present. Non-tender.  Extremities: No lower extremity edema. Pulses are 2 + in the bilateral DP/PT.  Echo 03/01/17: - Left ventricle: The cavity size was mildly dilated. Systolic   function was mildly reduced. The estimated ejection fraction was   in the range of 45% to 50%. Images were inadequate for LV wall   motion assessment. Features are consistent with a pseudonormal   left ventricular filling pattern, with concomitant abnormal   relaxation and increased filling pressure (grade 2 diastolic   dysfunction). - Aortic valve: Mildly to moderately calcified annulus. Trileaflet;   normal thickness, mildly calcified leaflets. There was mild   regurgitation. Regurgitation pressure half-time: 675 ms. - Mitral valve: There was mild regurgitation directed eccentrically   and posteriorly. - Left atrium: The atrium was mildly dilated. - Pulmonic valve: There was trivial  regurgitation. - Pulmonary arteries: Systolic pressure could not be accurately   estimated.   EKG:  EKG is ordered today. The ekg ordered today demonstrates Sinus bradycardia. Rate 51 bpm. Poor R wave progression.   Recent Labs: 12/25/2018: ALT 22; BUN 20; Creatinine, Ser 1.32; Hemoglobin 13.6; Platelets 243.0; Potassium 4.2; Sodium 138   Lipid Panel    Component Value Date/Time  CHOL 105 12/25/2018 0953   TRIG 65.0 12/25/2018 0953   HDL 54.30 12/25/2018 0953   CHOLHDL 2 12/25/2018 0953   VLDL 13.0 12/25/2018 0953   LDLCALC 38 12/25/2018 0953     Wt Readings from Last 3 Encounters:  02/23/19 176 lb 3.2 oz (79.9 kg)  12/25/18 173 lb (78.5 kg)  12/25/18 173 lb (78.5 kg)     Other studies Reviewed: Additional studies/ records that were reviewed today include: . Review of the above records demonstrates:   Assessment and Plan:   1. CAD without angina: He has no chest pain. Continue ASA and statin. He is not on a beta blocker due to bradycardia.    2. HLD: LDL at goal. Continue statin.   3. Aortic insufficiency: Mild by echo 2018. Repeat echo in 2021.   4. Mitral regurgitation: Mild by echo in 2018. Repeat echo in 2021  Current medicines are reviewed at length with the patient today.  The patient does not have concerns regarding medicines.  The following changes have been made:  no change  Labs/ tests ordered today include:   No orders of the defined types were placed in this encounter.  Disposition:   FU with me in 12 months  Signed, Verne Carrow, MD 02/23/2019 10:52 AM    [facility] [address] [place], [place], [place]  [address] Phone: [phone]; Fax: [phone]

## 2019-03-29 ENCOUNTER — Ambulatory Visit: Payer: Medicare Other | Admitting: Cardiovascular Disease

## 2019-04-24 ENCOUNTER — Telehealth: Payer: Self-pay

## 2019-04-24 MED ORDER — DOXAZOSIN MESYLATE 4 MG PO TABS: 8.0000 mg | ORAL_TABLET | Freq: Every day | ORAL | 0 refills | Status: DC

## 2019-04-24 NOTE — Telephone Encounter (Signed)
Patient informed med was sent  

## 2019-04-24 NOTE — Telephone Encounter (Signed)
Sent in

## 2019-04-24 NOTE — Addendum Note (Signed)
Addended by: Hillard Danker A on: 04/24/2019 11:16 AM   Modules accepted: Orders

## 2019-04-24 NOTE — Telephone Encounter (Signed)
Patient came in office and stated that he gets his medications mailed form the [place] and he has not received them yet. patient needs small supply of doxazosin 8mg  sent to [facility]

## 2019-05-16 ENCOUNTER — Other Ambulatory Visit: Payer: Self-pay | Admitting: Internal Medicine

## 2019-09-05 ENCOUNTER — Ambulatory Visit (INDEPENDENT_AMBULATORY_CARE_PROVIDER_SITE_OTHER): Payer: Medicare Other

## 2019-09-05 DIAGNOSIS — Z23 Encounter for immunization: Secondary | ICD-10-CM | POA: Diagnosis not present

## 2019-10-31 DIAGNOSIS — M179 Osteoarthritis of knee, unspecified: Secondary | ICD-10-CM | POA: Diagnosis not present

## 2019-10-31 DIAGNOSIS — R410 Disorientation, unspecified: Secondary | ICD-10-CM | POA: Diagnosis not present

## 2019-10-31 DIAGNOSIS — I2510 Atherosclerotic heart disease of native coronary artery without angina pectoris: Secondary | ICD-10-CM | POA: Diagnosis not present

## 2019-11-29 DIAGNOSIS — M25561 Pain in right knee: Secondary | ICD-10-CM | POA: Diagnosis not present

## 2019-11-29 DIAGNOSIS — M545 Low back pain: Secondary | ICD-10-CM | POA: Diagnosis not present

## 2019-11-29 DIAGNOSIS — I2510 Atherosclerotic heart disease of native coronary artery without angina pectoris: Secondary | ICD-10-CM | POA: Diagnosis not present

## 2019-11-29 DIAGNOSIS — M179 Osteoarthritis of knee, unspecified: Secondary | ICD-10-CM | POA: Diagnosis not present

## 2019-11-30 DIAGNOSIS — G8929 Other chronic pain: Secondary | ICD-10-CM | POA: Diagnosis not present

## 2019-11-30 DIAGNOSIS — M47819 Spondylosis without myelopathy or radiculopathy, site unspecified: Secondary | ICD-10-CM | POA: Diagnosis not present

## 2019-11-30 DIAGNOSIS — I2510 Atherosclerotic heart disease of native coronary artery without angina pectoris: Secondary | ICD-10-CM | POA: Diagnosis not present

## 2019-11-30 DIAGNOSIS — M170 Bilateral primary osteoarthritis of knee: Secondary | ICD-10-CM | POA: Diagnosis not present

## 2019-11-30 DIAGNOSIS — I10 Essential (primary) hypertension: Secondary | ICD-10-CM | POA: Diagnosis not present

## 2019-12-02 DIAGNOSIS — M5136 Other intervertebral disc degeneration, lumbar region: Secondary | ICD-10-CM | POA: Diagnosis not present

## 2019-12-02 DIAGNOSIS — M25462 Effusion, left knee: Secondary | ICD-10-CM | POA: Diagnosis not present

## 2019-12-02 DIAGNOSIS — M25561 Pain in right knee: Secondary | ICD-10-CM | POA: Diagnosis not present

## 2019-12-02 DIAGNOSIS — M25552 Pain in left hip: Secondary | ICD-10-CM | POA: Diagnosis not present

## 2019-12-03 DIAGNOSIS — I2510 Atherosclerotic heart disease of native coronary artery without angina pectoris: Secondary | ICD-10-CM | POA: Diagnosis not present

## 2019-12-03 DIAGNOSIS — G8929 Other chronic pain: Secondary | ICD-10-CM | POA: Diagnosis not present

## 2019-12-03 DIAGNOSIS — M47819 Spondylosis without myelopathy or radiculopathy, site unspecified: Secondary | ICD-10-CM | POA: Diagnosis not present

## 2019-12-03 DIAGNOSIS — I10 Essential (primary) hypertension: Secondary | ICD-10-CM | POA: Diagnosis not present

## 2019-12-03 DIAGNOSIS — M170 Bilateral primary osteoarthritis of knee: Secondary | ICD-10-CM | POA: Diagnosis not present

## 2019-12-10 DIAGNOSIS — G8929 Other chronic pain: Secondary | ICD-10-CM | POA: Diagnosis not present

## 2019-12-10 DIAGNOSIS — I2510 Atherosclerotic heart disease of native coronary artery without angina pectoris: Secondary | ICD-10-CM | POA: Diagnosis not present

## 2019-12-10 DIAGNOSIS — I10 Essential (primary) hypertension: Secondary | ICD-10-CM | POA: Diagnosis not present

## 2019-12-10 DIAGNOSIS — M170 Bilateral primary osteoarthritis of knee: Secondary | ICD-10-CM | POA: Diagnosis not present

## 2019-12-10 DIAGNOSIS — M47819 Spondylosis without myelopathy or radiculopathy, site unspecified: Secondary | ICD-10-CM | POA: Diagnosis not present

## 2019-12-14 DIAGNOSIS — M47819 Spondylosis without myelopathy or radiculopathy, site unspecified: Secondary | ICD-10-CM | POA: Diagnosis not present

## 2019-12-14 DIAGNOSIS — G8929 Other chronic pain: Secondary | ICD-10-CM | POA: Diagnosis not present

## 2019-12-14 DIAGNOSIS — I10 Essential (primary) hypertension: Secondary | ICD-10-CM | POA: Diagnosis not present

## 2019-12-14 DIAGNOSIS — I2510 Atherosclerotic heart disease of native coronary artery without angina pectoris: Secondary | ICD-10-CM | POA: Diagnosis not present

## 2019-12-14 DIAGNOSIS — M170 Bilateral primary osteoarthritis of knee: Secondary | ICD-10-CM | POA: Diagnosis not present

## 2019-12-17 DIAGNOSIS — M47819 Spondylosis without myelopathy or radiculopathy, site unspecified: Secondary | ICD-10-CM | POA: Diagnosis not present

## 2019-12-17 DIAGNOSIS — I10 Essential (primary) hypertension: Secondary | ICD-10-CM | POA: Diagnosis not present

## 2019-12-17 DIAGNOSIS — M170 Bilateral primary osteoarthritis of knee: Secondary | ICD-10-CM | POA: Diagnosis not present

## 2019-12-17 DIAGNOSIS — I2510 Atherosclerotic heart disease of native coronary artery without angina pectoris: Secondary | ICD-10-CM | POA: Diagnosis not present

## 2019-12-17 DIAGNOSIS — G8929 Other chronic pain: Secondary | ICD-10-CM | POA: Diagnosis not present

## 2019-12-18 DIAGNOSIS — I10 Essential (primary) hypertension: Secondary | ICD-10-CM | POA: Diagnosis not present

## 2019-12-18 DIAGNOSIS — G8929 Other chronic pain: Secondary | ICD-10-CM | POA: Diagnosis not present

## 2019-12-18 DIAGNOSIS — M47819 Spondylosis without myelopathy or radiculopathy, site unspecified: Secondary | ICD-10-CM | POA: Diagnosis not present

## 2019-12-18 DIAGNOSIS — I2510 Atherosclerotic heart disease of native coronary artery without angina pectoris: Secondary | ICD-10-CM | POA: Diagnosis not present

## 2019-12-18 DIAGNOSIS — M170 Bilateral primary osteoarthritis of knee: Secondary | ICD-10-CM | POA: Diagnosis not present

## 2019-12-19 DIAGNOSIS — G8929 Other chronic pain: Secondary | ICD-10-CM | POA: Diagnosis not present

## 2019-12-19 DIAGNOSIS — M170 Bilateral primary osteoarthritis of knee: Secondary | ICD-10-CM | POA: Diagnosis not present

## 2019-12-19 DIAGNOSIS — M47819 Spondylosis without myelopathy or radiculopathy, site unspecified: Secondary | ICD-10-CM | POA: Diagnosis not present

## 2019-12-19 DIAGNOSIS — I10 Essential (primary) hypertension: Secondary | ICD-10-CM | POA: Diagnosis not present

## 2019-12-19 DIAGNOSIS — I2510 Atherosclerotic heart disease of native coronary artery without angina pectoris: Secondary | ICD-10-CM | POA: Diagnosis not present

## 2019-12-22 DIAGNOSIS — U071 COVID-19: Secondary | ICD-10-CM | POA: Diagnosis not present

## 2019-12-22 DIAGNOSIS — E876 Hypokalemia: Secondary | ICD-10-CM | POA: Diagnosis not present

## 2019-12-22 DIAGNOSIS — R279 Unspecified lack of coordination: Secondary | ICD-10-CM | POA: Diagnosis not present

## 2019-12-22 DIAGNOSIS — G9341 Metabolic encephalopathy: Secondary | ICD-10-CM | POA: Diagnosis not present

## 2019-12-22 DIAGNOSIS — I959 Hypotension, unspecified: Secondary | ICD-10-CM | POA: Diagnosis not present

## 2019-12-22 DIAGNOSIS — R5381 Other malaise: Secondary | ICD-10-CM | POA: Diagnosis not present

## 2019-12-22 DIAGNOSIS — M6281 Muscle weakness (generalized): Secondary | ICD-10-CM | POA: Diagnosis not present

## 2019-12-22 DIAGNOSIS — Z743 Need for continuous supervision: Secondary | ICD-10-CM | POA: Diagnosis not present

## 2019-12-22 DIAGNOSIS — Z789 Other specified health status: Secondary | ICD-10-CM | POA: Diagnosis not present

## 2019-12-22 DIAGNOSIS — E785 Hyperlipidemia, unspecified: Secondary | ICD-10-CM | POA: Diagnosis not present

## 2019-12-22 DIAGNOSIS — G9340 Encephalopathy, unspecified: Secondary | ICD-10-CM | POA: Diagnosis not present

## 2019-12-22 DIAGNOSIS — B9729 Other coronavirus as the cause of diseases classified elsewhere: Secondary | ICD-10-CM | POA: Diagnosis not present

## 2019-12-22 DIAGNOSIS — R0689 Other abnormalities of breathing: Secondary | ICD-10-CM | POA: Diagnosis not present

## 2019-12-22 DIAGNOSIS — R918 Other nonspecific abnormal finding of lung field: Secondary | ICD-10-CM | POA: Diagnosis not present

## 2019-12-22 DIAGNOSIS — I2510 Atherosclerotic heart disease of native coronary artery without angina pectoris: Secondary | ICD-10-CM | POA: Diagnosis not present

## 2019-12-22 DIAGNOSIS — R410 Disorientation, unspecified: Secondary | ICD-10-CM | POA: Diagnosis not present

## 2019-12-22 DIAGNOSIS — R531 Weakness: Secondary | ICD-10-CM | POA: Diagnosis not present

## 2019-12-22 DIAGNOSIS — J1282 Pneumonia due to Coronavirus disease 2019: Secondary | ICD-10-CM | POA: Diagnosis not present

## 2019-12-23 DIAGNOSIS — U071 COVID-19: Secondary | ICD-10-CM | POA: Diagnosis not present

## 2019-12-23 DIAGNOSIS — J1282 Pneumonia due to Coronavirus disease 2019: Secondary | ICD-10-CM | POA: Diagnosis not present

## 2019-12-23 DIAGNOSIS — R531 Weakness: Secondary | ICD-10-CM | POA: Diagnosis not present

## 2019-12-23 DIAGNOSIS — G9340 Encephalopathy, unspecified: Secondary | ICD-10-CM | POA: Diagnosis not present

## 2019-12-23 DIAGNOSIS — E876 Hypokalemia: Secondary | ICD-10-CM | POA: Diagnosis not present

## 2019-12-24 DIAGNOSIS — R531 Weakness: Secondary | ICD-10-CM | POA: Diagnosis not present

## 2019-12-24 DIAGNOSIS — M6281 Muscle weakness (generalized): Secondary | ICD-10-CM | POA: Diagnosis not present

## 2019-12-24 DIAGNOSIS — R279 Unspecified lack of coordination: Secondary | ICD-10-CM | POA: Diagnosis not present

## 2019-12-24 DIAGNOSIS — R5381 Other malaise: Secondary | ICD-10-CM | POA: Diagnosis not present

## 2019-12-24 MED ORDER — ATORVASTATIN CALCIUM 10 MG PO TABS
5.00 | ORAL_TABLET | ORAL | Status: DC
Start: 2019-12-24 — End: 2019-12-24

## 2019-12-24 MED ORDER — ASPIRIN 81 MG PO CHEW
81.00 | CHEWABLE_TABLET | ORAL | Status: DC
Start: 2019-12-25 — End: 2019-12-24

## 2019-12-24 MED ORDER — DOXAZOSIN MESYLATE 4 MG PO TABS
8.00 | ORAL_TABLET | ORAL | Status: DC
Start: 2019-12-24 — End: 2019-12-24

## 2019-12-24 MED ORDER — ONDANSETRON HCL 4 MG/2ML IJ SOLN
4.00 | INTRAMUSCULAR | Status: DC
Start: ? — End: 2019-12-24

## 2019-12-24 MED ORDER — ACETAMINOPHEN 325 MG PO TABS
650.00 | ORAL_TABLET | ORAL | Status: DC
Start: 2019-12-24 — End: 2019-12-24

## 2019-12-24 MED ORDER — SENNOSIDES 8.6 MG PO TABS
2.00 | ORAL_TABLET | ORAL | Status: DC
Start: ? — End: 2019-12-24

## 2019-12-24 MED ORDER — ACETAMINOPHEN 500 MG PO TABS
500.00 | ORAL_TABLET | ORAL | Status: DC
Start: ? — End: 2019-12-24

## 2019-12-24 MED ORDER — ENOXAPARIN SODIUM 40 MG/0.4ML ~~LOC~~ SOLN
40.00 | SUBCUTANEOUS | Status: DC
Start: 2019-12-24 — End: 2019-12-24

## 2019-12-24 MED ORDER — ALUM & MAG HYDROXIDE-SIMETH 200-200-20 MG/5ML PO SUSP
30.00 | ORAL | Status: DC
Start: ? — End: 2019-12-24

## 2020-01-02 ENCOUNTER — Ambulatory Visit: Payer: Medicare Other | Admitting: Internal Medicine

## 2020-01-02 NOTE — Progress Notes (Signed)
Virtual Visit via Video Note  I connected with Wesley [name]. on 01/02/20 at  9:40 AM EST by a video enabled telemedicine application however he did not answer and no number we had on file we could reach him. Visit not completed.   [name], MD

## 2020-01-09 DIAGNOSIS — I2510 Atherosclerotic heart disease of native coronary artery without angina pectoris: Secondary | ICD-10-CM | POA: Diagnosis not present

## 2020-01-09 DIAGNOSIS — M47819 Spondylosis without myelopathy or radiculopathy, site unspecified: Secondary | ICD-10-CM | POA: Diagnosis not present

## 2020-01-09 DIAGNOSIS — G8929 Other chronic pain: Secondary | ICD-10-CM | POA: Diagnosis not present

## 2020-01-09 DIAGNOSIS — M170 Bilateral primary osteoarthritis of knee: Secondary | ICD-10-CM | POA: Diagnosis not present

## 2020-01-09 DIAGNOSIS — I10 Essential (primary) hypertension: Secondary | ICD-10-CM | POA: Diagnosis not present

## 2020-01-22 DIAGNOSIS — M170 Bilateral primary osteoarthritis of knee: Secondary | ICD-10-CM | POA: Diagnosis not present

## 2020-01-22 DIAGNOSIS — G8929 Other chronic pain: Secondary | ICD-10-CM | POA: Diagnosis not present

## 2020-01-22 DIAGNOSIS — I2510 Atherosclerotic heart disease of native coronary artery without angina pectoris: Secondary | ICD-10-CM | POA: Diagnosis not present

## 2020-01-22 DIAGNOSIS — M47819 Spondylosis without myelopathy or radiculopathy, site unspecified: Secondary | ICD-10-CM | POA: Diagnosis not present

## 2020-01-22 DIAGNOSIS — I10 Essential (primary) hypertension: Secondary | ICD-10-CM | POA: Diagnosis not present

## 2020-01-24 DIAGNOSIS — M47819 Spondylosis without myelopathy or radiculopathy, site unspecified: Secondary | ICD-10-CM | POA: Diagnosis not present

## 2020-01-24 DIAGNOSIS — I2510 Atherosclerotic heart disease of native coronary artery without angina pectoris: Secondary | ICD-10-CM | POA: Diagnosis not present

## 2020-01-24 DIAGNOSIS — G8929 Other chronic pain: Secondary | ICD-10-CM | POA: Diagnosis not present

## 2020-01-24 DIAGNOSIS — I10 Essential (primary) hypertension: Secondary | ICD-10-CM | POA: Diagnosis not present

## 2020-01-24 DIAGNOSIS — M170 Bilateral primary osteoarthritis of knee: Secondary | ICD-10-CM | POA: Diagnosis not present

## 2020-06-03 DIAGNOSIS — M179 Osteoarthritis of knee, unspecified: Secondary | ICD-10-CM | POA: Diagnosis not present

## 2020-06-03 DIAGNOSIS — H9193 Unspecified hearing loss, bilateral: Secondary | ICD-10-CM | POA: Diagnosis not present

## 2020-06-03 DIAGNOSIS — I10 Essential (primary) hypertension: Secondary | ICD-10-CM | POA: Diagnosis not present

## 2020-07-09 DIAGNOSIS — H9193 Unspecified hearing loss, bilateral: Secondary | ICD-10-CM | POA: Diagnosis not present

## 2020-07-09 DIAGNOSIS — M179 Osteoarthritis of knee, unspecified: Secondary | ICD-10-CM | POA: Diagnosis not present

## 2020-07-09 DIAGNOSIS — I2510 Atherosclerotic heart disease of native coronary artery without angina pectoris: Secondary | ICD-10-CM | POA: Diagnosis not present

## 2020-07-10 DIAGNOSIS — M810 Age-related osteoporosis without current pathological fracture: Secondary | ICD-10-CM | POA: Diagnosis not present

## 2020-07-10 DIAGNOSIS — D649 Anemia, unspecified: Secondary | ICD-10-CM | POA: Diagnosis not present

## 2020-07-23 DIAGNOSIS — M4802 Spinal stenosis, cervical region: Secondary | ICD-10-CM | POA: Diagnosis not present

## 2020-07-23 DIAGNOSIS — M546 Pain in thoracic spine: Secondary | ICD-10-CM | POA: Diagnosis not present

## 2020-07-23 DIAGNOSIS — S12600A Unspecified displaced fracture of seventh cervical vertebra, initial encounter for closed fracture: Secondary | ICD-10-CM | POA: Diagnosis not present

## 2020-07-23 DIAGNOSIS — M549 Dorsalgia, unspecified: Secondary | ICD-10-CM | POA: Diagnosis not present

## 2020-07-23 DIAGNOSIS — M4312 Spondylolisthesis, cervical region: Secondary | ICD-10-CM | POA: Diagnosis not present

## 2020-07-23 DIAGNOSIS — M542 Cervicalgia: Secondary | ICD-10-CM | POA: Diagnosis not present

## 2020-07-23 DIAGNOSIS — M532X2 Spinal instabilities, cervical region: Secondary | ICD-10-CM | POA: Diagnosis not present

## 2020-07-23 DIAGNOSIS — M2578 Osteophyte, vertebrae: Secondary | ICD-10-CM | POA: Diagnosis not present

## 2020-07-23 DIAGNOSIS — Z743 Need for continuous supervision: Secondary | ICD-10-CM | POA: Diagnosis not present

## 2020-07-23 DIAGNOSIS — M25562 Pain in left knee: Secondary | ICD-10-CM | POA: Diagnosis not present

## 2020-07-23 DIAGNOSIS — W19XXXA Unspecified fall, initial encounter: Secondary | ICD-10-CM | POA: Diagnosis not present

## 2020-07-23 DIAGNOSIS — R29890 Loss of height: Secondary | ICD-10-CM | POA: Diagnosis not present

## 2020-07-23 DIAGNOSIS — S12690A Other displaced fracture of seventh cervical vertebra, initial encounter for closed fracture: Secondary | ICD-10-CM | POA: Diagnosis not present

## 2020-07-23 DIAGNOSIS — M47812 Spondylosis without myelopathy or radiculopathy, cervical region: Secondary | ICD-10-CM | POA: Diagnosis not present

## 2020-07-23 DIAGNOSIS — Z87891 Personal history of nicotine dependence: Secondary | ICD-10-CM | POA: Diagnosis not present

## 2020-08-07 DIAGNOSIS — M5380 Other specified dorsopathies, site unspecified: Secondary | ICD-10-CM | POA: Diagnosis not present

## 2020-08-07 DIAGNOSIS — S12600D Unspecified displaced fracture of seventh cervical vertebra, subsequent encounter for fracture with routine healing: Secondary | ICD-10-CM | POA: Diagnosis not present

## 2020-08-07 DIAGNOSIS — M4312 Spondylolisthesis, cervical region: Secondary | ICD-10-CM | POA: Diagnosis not present

## 2020-08-07 DIAGNOSIS — W1839XD Other fall on same level, subsequent encounter: Secondary | ICD-10-CM | POA: Diagnosis not present

## 2020-08-07 DIAGNOSIS — J984 Other disorders of lung: Secondary | ICD-10-CM | POA: Diagnosis not present

## 2020-08-12 DIAGNOSIS — H9319 Tinnitus, unspecified ear: Secondary | ICD-10-CM | POA: Diagnosis not present

## 2020-08-12 DIAGNOSIS — M179 Osteoarthritis of knee, unspecified: Secondary | ICD-10-CM | POA: Diagnosis not present

## 2020-08-12 DIAGNOSIS — I10 Essential (primary) hypertension: Secondary | ICD-10-CM | POA: Diagnosis not present

## 2020-08-12 DIAGNOSIS — I2510 Atherosclerotic heart disease of native coronary artery without angina pectoris: Secondary | ICD-10-CM | POA: Diagnosis not present

## 2020-08-14 DIAGNOSIS — I2510 Atherosclerotic heart disease of native coronary artery without angina pectoris: Secondary | ICD-10-CM | POA: Diagnosis not present

## 2020-08-14 DIAGNOSIS — D649 Anemia, unspecified: Secondary | ICD-10-CM | POA: Diagnosis not present

## 2020-08-14 DIAGNOSIS — E559 Vitamin D deficiency, unspecified: Secondary | ICD-10-CM | POA: Diagnosis not present

## 2020-08-14 DIAGNOSIS — E1169 Type 2 diabetes mellitus with other specified complication: Secondary | ICD-10-CM | POA: Diagnosis not present

## 2020-09-08 DIAGNOSIS — M50321 Other cervical disc degeneration at C4-C5 level: Secondary | ICD-10-CM | POA: Diagnosis not present

## 2020-09-08 DIAGNOSIS — W1839XD Other fall on same level, subsequent encounter: Secondary | ICD-10-CM | POA: Diagnosis not present

## 2020-09-08 DIAGNOSIS — S12600D Unspecified displaced fracture of seventh cervical vertebra, subsequent encounter for fracture with routine healing: Secondary | ICD-10-CM | POA: Diagnosis not present

## 2020-09-08 DIAGNOSIS — M4312 Spondylolisthesis, cervical region: Secondary | ICD-10-CM | POA: Diagnosis not present

## 2020-09-18 DIAGNOSIS — S81001A Unspecified open wound, right knee, initial encounter: Secondary | ICD-10-CM | POA: Diagnosis not present

## 2020-09-18 DIAGNOSIS — M179 Osteoarthritis of knee, unspecified: Secondary | ICD-10-CM | POA: Diagnosis not present

## 2020-09-18 DIAGNOSIS — S81002A Unspecified open wound, left knee, initial encounter: Secondary | ICD-10-CM | POA: Diagnosis not present

## 2020-09-18 DIAGNOSIS — I2510 Atherosclerotic heart disease of native coronary artery without angina pectoris: Secondary | ICD-10-CM | POA: Diagnosis not present

## 2020-09-26 DIAGNOSIS — L89892 Pressure ulcer of other site, stage 2: Secondary | ICD-10-CM | POA: Diagnosis not present

## 2020-09-26 DIAGNOSIS — S12600D Unspecified displaced fracture of seventh cervical vertebra, subsequent encounter for fracture with routine healing: Secondary | ICD-10-CM | POA: Diagnosis not present

## 2020-09-26 DIAGNOSIS — I2510 Atherosclerotic heart disease of native coronary artery without angina pectoris: Secondary | ICD-10-CM | POA: Diagnosis not present

## 2020-09-26 DIAGNOSIS — M172 Bilateral post-traumatic osteoarthritis of knee: Secondary | ICD-10-CM | POA: Diagnosis not present

## 2020-09-26 DIAGNOSIS — M1712 Unilateral primary osteoarthritis, left knee: Secondary | ICD-10-CM | POA: Diagnosis not present

## 2020-09-26 DIAGNOSIS — I252 Old myocardial infarction: Secondary | ICD-10-CM | POA: Diagnosis not present

## 2020-09-30 DIAGNOSIS — I252 Old myocardial infarction: Secondary | ICD-10-CM | POA: Diagnosis not present

## 2020-09-30 DIAGNOSIS — L89892 Pressure ulcer of other site, stage 2: Secondary | ICD-10-CM | POA: Diagnosis not present

## 2020-09-30 DIAGNOSIS — I2510 Atherosclerotic heart disease of native coronary artery without angina pectoris: Secondary | ICD-10-CM | POA: Diagnosis not present

## 2020-09-30 DIAGNOSIS — M9971 Connective tissue and disc stenosis of intervertebral foramina of cervical region: Secondary | ICD-10-CM | POA: Diagnosis not present

## 2020-09-30 DIAGNOSIS — M47812 Spondylosis without myelopathy or radiculopathy, cervical region: Secondary | ICD-10-CM | POA: Diagnosis not present

## 2020-09-30 DIAGNOSIS — M1712 Unilateral primary osteoarthritis, left knee: Secondary | ICD-10-CM | POA: Diagnosis not present

## 2020-09-30 DIAGNOSIS — M4802 Spinal stenosis, cervical region: Secondary | ICD-10-CM | POA: Diagnosis not present

## 2020-09-30 DIAGNOSIS — M438X2 Other specified deforming dorsopathies, cervical region: Secondary | ICD-10-CM | POA: Diagnosis not present

## 2020-09-30 DIAGNOSIS — S12600D Unspecified displaced fracture of seventh cervical vertebra, subsequent encounter for fracture with routine healing: Secondary | ICD-10-CM | POA: Diagnosis not present

## 2020-10-03 DIAGNOSIS — L89892 Pressure ulcer of other site, stage 2: Secondary | ICD-10-CM | POA: Diagnosis not present

## 2020-10-03 DIAGNOSIS — S12600D Unspecified displaced fracture of seventh cervical vertebra, subsequent encounter for fracture with routine healing: Secondary | ICD-10-CM | POA: Diagnosis not present

## 2020-10-03 DIAGNOSIS — I2510 Atherosclerotic heart disease of native coronary artery without angina pectoris: Secondary | ICD-10-CM | POA: Diagnosis not present

## 2020-10-03 DIAGNOSIS — I252 Old myocardial infarction: Secondary | ICD-10-CM | POA: Diagnosis not present

## 2020-10-03 DIAGNOSIS — M1712 Unilateral primary osteoarthritis, left knee: Secondary | ICD-10-CM | POA: Diagnosis not present

## 2020-10-07 DIAGNOSIS — S81009A Unspecified open wound, unspecified knee, initial encounter: Secondary | ICD-10-CM | POA: Diagnosis not present

## 2020-10-07 DIAGNOSIS — S12600D Unspecified displaced fracture of seventh cervical vertebra, subsequent encounter for fracture with routine healing: Secondary | ICD-10-CM | POA: Diagnosis not present

## 2020-10-07 DIAGNOSIS — M1712 Unilateral primary osteoarthritis, left knee: Secondary | ICD-10-CM | POA: Diagnosis not present

## 2020-10-07 DIAGNOSIS — S81001A Unspecified open wound, right knee, initial encounter: Secondary | ICD-10-CM | POA: Diagnosis not present

## 2020-10-07 DIAGNOSIS — I252 Old myocardial infarction: Secondary | ICD-10-CM | POA: Diagnosis not present

## 2020-10-07 DIAGNOSIS — I2510 Atherosclerotic heart disease of native coronary artery without angina pectoris: Secondary | ICD-10-CM | POA: Diagnosis not present

## 2020-10-07 DIAGNOSIS — L89892 Pressure ulcer of other site, stage 2: Secondary | ICD-10-CM | POA: Diagnosis not present

## 2020-10-09 DIAGNOSIS — S12600G Unspecified displaced fracture of seventh cervical vertebra, subsequent encounter for fracture with delayed healing: Secondary | ICD-10-CM | POA: Diagnosis not present

## 2020-10-09 DIAGNOSIS — W1839XD Other fall on same level, subsequent encounter: Secondary | ICD-10-CM | POA: Diagnosis not present

## 2020-10-10 DIAGNOSIS — S12600D Unspecified displaced fracture of seventh cervical vertebra, subsequent encounter for fracture with routine healing: Secondary | ICD-10-CM | POA: Diagnosis not present

## 2020-10-10 DIAGNOSIS — S81001A Unspecified open wound, right knee, initial encounter: Secondary | ICD-10-CM | POA: Diagnosis not present

## 2020-10-10 DIAGNOSIS — S81009A Unspecified open wound, unspecified knee, initial encounter: Secondary | ICD-10-CM | POA: Diagnosis not present

## 2020-10-10 DIAGNOSIS — I2510 Atherosclerotic heart disease of native coronary artery without angina pectoris: Secondary | ICD-10-CM | POA: Diagnosis not present

## 2020-10-10 DIAGNOSIS — L89892 Pressure ulcer of other site, stage 2: Secondary | ICD-10-CM | POA: Diagnosis not present

## 2020-10-10 DIAGNOSIS — M1712 Unilateral primary osteoarthritis, left knee: Secondary | ICD-10-CM | POA: Diagnosis not present

## 2020-10-10 DIAGNOSIS — I252 Old myocardial infarction: Secondary | ICD-10-CM | POA: Diagnosis not present

## 2020-10-14 DIAGNOSIS — I2510 Atherosclerotic heart disease of native coronary artery without angina pectoris: Secondary | ICD-10-CM | POA: Diagnosis not present

## 2020-10-14 DIAGNOSIS — M1712 Unilateral primary osteoarthritis, left knee: Secondary | ICD-10-CM | POA: Diagnosis not present

## 2020-10-14 DIAGNOSIS — S12600D Unspecified displaced fracture of seventh cervical vertebra, subsequent encounter for fracture with routine healing: Secondary | ICD-10-CM | POA: Diagnosis not present

## 2020-10-14 DIAGNOSIS — L89892 Pressure ulcer of other site, stage 2: Secondary | ICD-10-CM | POA: Diagnosis not present

## 2020-10-14 DIAGNOSIS — I252 Old myocardial infarction: Secondary | ICD-10-CM | POA: Diagnosis not present

## 2020-10-17 DIAGNOSIS — I252 Old myocardial infarction: Secondary | ICD-10-CM | POA: Diagnosis not present

## 2020-10-17 DIAGNOSIS — M1712 Unilateral primary osteoarthritis, left knee: Secondary | ICD-10-CM | POA: Diagnosis not present

## 2020-10-17 DIAGNOSIS — I2510 Atherosclerotic heart disease of native coronary artery without angina pectoris: Secondary | ICD-10-CM | POA: Diagnosis not present

## 2020-10-17 DIAGNOSIS — L89892 Pressure ulcer of other site, stage 2: Secondary | ICD-10-CM | POA: Diagnosis not present

## 2020-10-17 DIAGNOSIS — S12600D Unspecified displaced fracture of seventh cervical vertebra, subsequent encounter for fracture with routine healing: Secondary | ICD-10-CM | POA: Diagnosis not present

## 2020-10-21 DIAGNOSIS — M1712 Unilateral primary osteoarthritis, left knee: Secondary | ICD-10-CM | POA: Diagnosis not present

## 2020-10-21 DIAGNOSIS — S12600D Unspecified displaced fracture of seventh cervical vertebra, subsequent encounter for fracture with routine healing: Secondary | ICD-10-CM | POA: Diagnosis not present

## 2020-10-21 DIAGNOSIS — L89892 Pressure ulcer of other site, stage 2: Secondary | ICD-10-CM | POA: Diagnosis not present

## 2020-10-21 DIAGNOSIS — I252 Old myocardial infarction: Secondary | ICD-10-CM | POA: Diagnosis not present

## 2020-10-21 DIAGNOSIS — I2510 Atherosclerotic heart disease of native coronary artery without angina pectoris: Secondary | ICD-10-CM | POA: Diagnosis not present

## 2020-10-22 DIAGNOSIS — I252 Old myocardial infarction: Secondary | ICD-10-CM | POA: Diagnosis not present

## 2020-10-22 DIAGNOSIS — S12600D Unspecified displaced fracture of seventh cervical vertebra, subsequent encounter for fracture with routine healing: Secondary | ICD-10-CM | POA: Diagnosis not present

## 2020-10-22 DIAGNOSIS — M1712 Unilateral primary osteoarthritis, left knee: Secondary | ICD-10-CM | POA: Diagnosis not present

## 2020-10-22 DIAGNOSIS — L89892 Pressure ulcer of other site, stage 2: Secondary | ICD-10-CM | POA: Diagnosis not present

## 2020-10-22 DIAGNOSIS — I2510 Atherosclerotic heart disease of native coronary artery without angina pectoris: Secondary | ICD-10-CM | POA: Diagnosis not present

## 2020-10-24 DIAGNOSIS — M1712 Unilateral primary osteoarthritis, left knee: Secondary | ICD-10-CM | POA: Diagnosis not present

## 2020-10-24 DIAGNOSIS — I252 Old myocardial infarction: Secondary | ICD-10-CM | POA: Diagnosis not present

## 2020-10-24 DIAGNOSIS — I2510 Atherosclerotic heart disease of native coronary artery without angina pectoris: Secondary | ICD-10-CM | POA: Diagnosis not present

## 2020-10-24 DIAGNOSIS — S12600D Unspecified displaced fracture of seventh cervical vertebra, subsequent encounter for fracture with routine healing: Secondary | ICD-10-CM | POA: Diagnosis not present

## 2020-10-24 DIAGNOSIS — L89892 Pressure ulcer of other site, stage 2: Secondary | ICD-10-CM | POA: Diagnosis not present

## 2020-10-28 DIAGNOSIS — L89892 Pressure ulcer of other site, stage 2: Secondary | ICD-10-CM | POA: Diagnosis not present

## 2020-10-28 DIAGNOSIS — S12600D Unspecified displaced fracture of seventh cervical vertebra, subsequent encounter for fracture with routine healing: Secondary | ICD-10-CM | POA: Diagnosis not present

## 2020-10-28 DIAGNOSIS — I252 Old myocardial infarction: Secondary | ICD-10-CM | POA: Diagnosis not present

## 2020-10-28 DIAGNOSIS — M1712 Unilateral primary osteoarthritis, left knee: Secondary | ICD-10-CM | POA: Diagnosis not present

## 2020-10-28 DIAGNOSIS — I2510 Atherosclerotic heart disease of native coronary artery without angina pectoris: Secondary | ICD-10-CM | POA: Diagnosis not present

## 2020-10-30 DIAGNOSIS — I2510 Atherosclerotic heart disease of native coronary artery without angina pectoris: Secondary | ICD-10-CM | POA: Diagnosis not present

## 2020-10-30 DIAGNOSIS — S12600D Unspecified displaced fracture of seventh cervical vertebra, subsequent encounter for fracture with routine healing: Secondary | ICD-10-CM | POA: Diagnosis not present

## 2020-10-30 DIAGNOSIS — L89892 Pressure ulcer of other site, stage 2: Secondary | ICD-10-CM | POA: Diagnosis not present

## 2020-10-30 DIAGNOSIS — M1712 Unilateral primary osteoarthritis, left knee: Secondary | ICD-10-CM | POA: Diagnosis not present

## 2020-10-30 DIAGNOSIS — I252 Old myocardial infarction: Secondary | ICD-10-CM | POA: Diagnosis not present

## 2020-11-03 DIAGNOSIS — L89892 Pressure ulcer of other site, stage 2: Secondary | ICD-10-CM | POA: Diagnosis not present

## 2020-11-03 DIAGNOSIS — M1712 Unilateral primary osteoarthritis, left knee: Secondary | ICD-10-CM | POA: Diagnosis not present

## 2020-11-03 DIAGNOSIS — S12600D Unspecified displaced fracture of seventh cervical vertebra, subsequent encounter for fracture with routine healing: Secondary | ICD-10-CM | POA: Diagnosis not present

## 2020-11-03 DIAGNOSIS — I252 Old myocardial infarction: Secondary | ICD-10-CM | POA: Diagnosis not present

## 2020-11-03 DIAGNOSIS — I2510 Atherosclerotic heart disease of native coronary artery without angina pectoris: Secondary | ICD-10-CM | POA: Diagnosis not present

## 2020-11-05 DIAGNOSIS — M1712 Unilateral primary osteoarthritis, left knee: Secondary | ICD-10-CM | POA: Diagnosis not present

## 2020-11-05 DIAGNOSIS — I2510 Atherosclerotic heart disease of native coronary artery without angina pectoris: Secondary | ICD-10-CM | POA: Diagnosis not present

## 2020-11-05 DIAGNOSIS — I252 Old myocardial infarction: Secondary | ICD-10-CM | POA: Diagnosis not present

## 2020-11-05 DIAGNOSIS — L89892 Pressure ulcer of other site, stage 2: Secondary | ICD-10-CM | POA: Diagnosis not present

## 2020-11-05 DIAGNOSIS — S12600D Unspecified displaced fracture of seventh cervical vertebra, subsequent encounter for fracture with routine healing: Secondary | ICD-10-CM | POA: Diagnosis not present

## 2020-11-10 DIAGNOSIS — M4312 Spondylolisthesis, cervical region: Secondary | ICD-10-CM | POA: Diagnosis not present

## 2020-11-10 DIAGNOSIS — S12690G Other displaced fracture of seventh cervical vertebra, subsequent encounter for fracture with delayed healing: Secondary | ICD-10-CM | POA: Diagnosis not present

## 2020-11-10 DIAGNOSIS — M4852XA Collapsed vertebra, not elsewhere classified, cervical region, initial encounter for fracture: Secondary | ICD-10-CM | POA: Diagnosis not present

## 2020-11-10 DIAGNOSIS — Z09 Encounter for follow-up examination after completed treatment for conditions other than malignant neoplasm: Secondary | ICD-10-CM | POA: Diagnosis not present

## 2020-11-10 DIAGNOSIS — W1839XD Other fall on same level, subsequent encounter: Secondary | ICD-10-CM | POA: Diagnosis not present

## 2020-11-11 DIAGNOSIS — L89892 Pressure ulcer of other site, stage 2: Secondary | ICD-10-CM | POA: Diagnosis not present

## 2020-11-11 DIAGNOSIS — I252 Old myocardial infarction: Secondary | ICD-10-CM | POA: Diagnosis not present

## 2020-11-11 DIAGNOSIS — M1712 Unilateral primary osteoarthritis, left knee: Secondary | ICD-10-CM | POA: Diagnosis not present

## 2020-11-11 DIAGNOSIS — I2510 Atherosclerotic heart disease of native coronary artery without angina pectoris: Secondary | ICD-10-CM | POA: Diagnosis not present

## 2020-11-11 DIAGNOSIS — S12600D Unspecified displaced fracture of seventh cervical vertebra, subsequent encounter for fracture with routine healing: Secondary | ICD-10-CM | POA: Diagnosis not present

## 2020-11-13 DIAGNOSIS — I2510 Atherosclerotic heart disease of native coronary artery without angina pectoris: Secondary | ICD-10-CM | POA: Diagnosis not present

## 2020-11-13 DIAGNOSIS — S12600D Unspecified displaced fracture of seventh cervical vertebra, subsequent encounter for fracture with routine healing: Secondary | ICD-10-CM | POA: Diagnosis not present

## 2020-11-13 DIAGNOSIS — M1712 Unilateral primary osteoarthritis, left knee: Secondary | ICD-10-CM | POA: Diagnosis not present

## 2020-11-13 DIAGNOSIS — I252 Old myocardial infarction: Secondary | ICD-10-CM | POA: Diagnosis not present

## 2020-11-13 DIAGNOSIS — L89892 Pressure ulcer of other site, stage 2: Secondary | ICD-10-CM | POA: Diagnosis not present

## 2020-11-19 DIAGNOSIS — L89892 Pressure ulcer of other site, stage 2: Secondary | ICD-10-CM | POA: Diagnosis not present

## 2020-11-19 DIAGNOSIS — I252 Old myocardial infarction: Secondary | ICD-10-CM | POA: Diagnosis not present

## 2020-11-19 DIAGNOSIS — M1712 Unilateral primary osteoarthritis, left knee: Secondary | ICD-10-CM | POA: Diagnosis not present

## 2020-11-19 DIAGNOSIS — I2510 Atherosclerotic heart disease of native coronary artery without angina pectoris: Secondary | ICD-10-CM | POA: Diagnosis not present

## 2020-11-19 DIAGNOSIS — S12600D Unspecified displaced fracture of seventh cervical vertebra, subsequent encounter for fracture with routine healing: Secondary | ICD-10-CM | POA: Diagnosis not present

## 2021-01-06 DIAGNOSIS — E785 Hyperlipidemia, unspecified: Secondary | ICD-10-CM | POA: Diagnosis not present

## 2021-01-06 DIAGNOSIS — R410 Disorientation, unspecified: Secondary | ICD-10-CM | POA: Diagnosis not present

## 2021-01-06 DIAGNOSIS — M179 Osteoarthritis of knee, unspecified: Secondary | ICD-10-CM | POA: Diagnosis not present

## 2021-01-06 DIAGNOSIS — I2510 Atherosclerotic heart disease of native coronary artery without angina pectoris: Secondary | ICD-10-CM | POA: Diagnosis not present

## 2021-01-08 DIAGNOSIS — M810 Age-related osteoporosis without current pathological fracture: Secondary | ICD-10-CM | POA: Diagnosis not present

## 2021-01-08 DIAGNOSIS — D644 Congenital dyserythropoietic anemia: Secondary | ICD-10-CM | POA: Diagnosis not present

## 2021-01-08 DIAGNOSIS — E1169 Type 2 diabetes mellitus with other specified complication: Secondary | ICD-10-CM | POA: Diagnosis not present

## 2021-10-16 ENCOUNTER — Telehealth: Payer: Self-pay | Admitting: *Deleted

## 2021-10-16 NOTE — Telephone Encounter (Signed)
Transition Care Management Unsuccessful Follow-up Telephone Call  Date of discharge and from where:  10/13/21 [facility]  Attempts:  1st Attempt  Reason for unsuccessful TCM follow-up call:  Left voice message  Cranford Mon RN, CCM, CDCES [place]  [facility] Care Management Coordinator - Managed [place] High Risk [phone]

## 2021-10-19 ENCOUNTER — Telehealth: Payer: Self-pay | Admitting: *Deleted

## 2021-10-19 NOTE — Telephone Encounter (Signed)
Transition Care Management Unsuccessful Follow-up Telephone Call  Date of discharge and from where:  10/13/21 [facility]  Attempts:  2nd Attempt  Reason for unsuccessful TCM follow-up call:  Left voice message   Cranford Mon RN, CCM, CDCES [place]  [facility] Care Management Coordinator - Managed [place] High Risk [phone]
